# Patient Record
Sex: Female | Born: 1980 | Hispanic: No | Marital: Single | Smoking: Never smoker
Health system: Southern US, Community
[De-identification: ages and names within clinical notes are randomized; demographics above are authoritative.]

## PROBLEM LIST (undated history)

## (undated) DIAGNOSIS — K649 Unspecified hemorrhoids: Secondary | ICD-10-CM

## (undated) DIAGNOSIS — E559 Vitamin D deficiency, unspecified: Secondary | ICD-10-CM

## (undated) DIAGNOSIS — K635 Polyp of colon: Secondary | ICD-10-CM

## (undated) DIAGNOSIS — N83209 Unspecified ovarian cyst, unspecified side: Secondary | ICD-10-CM

## (undated) DIAGNOSIS — K219 Gastro-esophageal reflux disease without esophagitis: Secondary | ICD-10-CM

## (undated) HISTORY — DX: Vitamin D deficiency, unspecified: E55.9

## (undated) HISTORY — DX: Polyp of colon: K63.5

## (undated) HISTORY — DX: Unspecified hemorrhoids: K64.9

## (undated) HISTORY — PX: APPENDECTOMY: SHX54

## (undated) HISTORY — DX: Gastro-esophageal reflux disease without esophagitis: K21.9

## (undated) HISTORY — PX: COLON SURGERY: SHX602

---
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
## 2006-04-22 ENCOUNTER — Ambulatory Visit: Payer: Self-pay | Admitting: Internal Medicine

## 2007-12-13 ENCOUNTER — Emergency Department (HOSPITAL_COMMUNITY): Admission: EM | Admit: 2007-12-13 | Discharge: 2007-12-14 | Payer: Self-pay | Admitting: Emergency Medicine

## 2008-01-12 ENCOUNTER — Emergency Department (HOSPITAL_COMMUNITY): Admission: EM | Admit: 2008-01-12 | Discharge: 2008-01-12 | Payer: Self-pay | Admitting: Emergency Medicine

## 2009-05-26 ENCOUNTER — Emergency Department (HOSPITAL_COMMUNITY): Admission: EM | Admit: 2009-05-26 | Discharge: 2009-05-26 | Payer: Self-pay | Admitting: Emergency Medicine

## 2009-12-03 ENCOUNTER — Emergency Department (HOSPITAL_COMMUNITY): Admission: EM | Admit: 2009-12-03 | Discharge: 2009-12-03 | Payer: Self-pay | Admitting: Emergency Medicine

## 2010-09-03 LAB — DIFFERENTIAL
Basophils Absolute: 0 10*3/uL (ref 0.0–0.1)
Lymphocytes Relative: 39 % (ref 12–46)
Monocytes Absolute: 0.7 10*3/uL (ref 0.1–1.0)
Monocytes Relative: 7 % (ref 3–12)
Neutro Abs: 4.6 10*3/uL (ref 1.7–7.7)

## 2010-09-03 LAB — URINALYSIS, ROUTINE W REFLEX MICROSCOPIC
Glucose, UA: NEGATIVE mg/dL
Leukocytes, UA: NEGATIVE
Protein, ur: NEGATIVE mg/dL
Specific Gravity, Urine: 1.015 (ref 1.005–1.030)
Urobilinogen, UA: 0.2 mg/dL (ref 0.0–1.0)

## 2010-09-03 LAB — COMPREHENSIVE METABOLIC PANEL
Albumin: 3.9 g/dL (ref 3.5–5.2)
BUN: 7 mg/dL (ref 6–23)
Calcium: 8.6 mg/dL (ref 8.4–10.5)
Creatinine, Ser: 0.68 mg/dL (ref 0.4–1.2)
Total Bilirubin: 0.4 mg/dL (ref 0.3–1.2)
Total Protein: 7.7 g/dL (ref 6.0–8.3)

## 2010-09-03 LAB — URINE MICROSCOPIC-ADD ON

## 2010-09-03 LAB — CBC
HCT: 35.8 % — ABNORMAL LOW (ref 36.0–46.0)
MCV: 99.5 fL (ref 78.0–100.0)
Platelets: 293 10*3/uL (ref 150–400)
RDW: 13.5 % (ref 11.5–15.5)

## 2010-09-19 LAB — POCT PREGNANCY, URINE: Preg Test, Ur: NEGATIVE

## 2010-09-19 LAB — URINALYSIS, ROUTINE W REFLEX MICROSCOPIC
Specific Gravity, Urine: 1.022 (ref 1.005–1.030)
Urobilinogen, UA: 0.2 mg/dL (ref 0.0–1.0)

## 2010-09-19 LAB — URINE MICROSCOPIC-ADD ON

## 2010-10-31 NOTE — Discharge Summary (Signed)
Christine Murillo, Christine Murillo              ACCOUNT NO.:  [id number]   MEDICAL RECORD NO.:  [id number]          PATIENT TYPE:  EMS   LOCATION:  ED                           FACILITY:  [facility]   PHYSICIAN:  Naima A. Dillard, M.D. DATE OF BIRTH:  02-18-1981   DATE OF ADMISSION:  12/13/2007  DATE OF DISCHARGE:                               DISCHARGE SUMMARY   HISTORY OF PRESENT ILLNESS:  The patient is a 30 year old gravida 1,  para 0-0-1-0, whose last menstrual period was on December 11, 2007, who  presented to the ER with nausea, vomiting, and abdominal pain for 2  days.  The patient states that the abdominal pain was constant and not  intermittent, and she was unable to tolerate p.o. or take anything for  the pain.  The patient denied having any UTI symptoms or fevers or  chills, but states that she was unable to tolerate any p.o. for the last  2 days since her period started.  When the patient came in, she had  appendicitis, so she had a CT scan, which was significant for slight  prominence of the appendix, but a pelvic mass.  She then had an  ultrasound, which was significant for a right ovarian cyst measuring 5  cm, which was consistent with a hemorrhagic cyst versus endometrioma  with positive venous and arterial flow.  The patient was given Zofran  and morphine, which did resolve the pain.   PAST MEDICAL HISTORY:  As above.   PAST SURGICAL HISTORY:  Significant for exploratory laparotomy in 2002  and she is not sure if it was her right or left ovary and tube that was  removed, for a large ovarian cyst that was benign.  She also had a D&C  for missed AB.   SOCIAL HISTORY:  Negative for tobacco and illicit drug use.  She has  occasional alcohol.   FAMILY HISTORY:  Unremarkable.   MEDICATIONS:  Loestrin 24.   ALLERGIES:  She has no known drug allergies.   PHYSICAL EXAMINATION:  VITAL SIGNS:  Her T-max is 99.2, pulse is 77,  respiratory rate is 78, and blood pressure is 113/64.  GENERAL:   The patient is in no apparent distress.  HEART:  Regular rate and rhythm.  LUNGS:  Clear to auscultation bilaterally.  ABDOMEN:  Nondistended, soft, and nontender.  There is no rebound or  guarding.  PELVIC:  Vulvovaginal exam is within normal limits.  She has no cervical  motion tenderness.  There is scant blood in the vault.  There is no  uterine or adnexal tenderness bilaterally.  EXTREMITIES:  No cyanosis, clubbing, or edema.   Ultrasound and CT as above.  The patient does have a white count of  13.2, hemoglobin of 13, and platelets of 343 with 87 segs.  Her BMET is  within normal limits.  Her UPT was negative.   ASSESSMENT:  Ovarian cyst, possible ruptured hemorrhagic cyst versus  endometrioma.  The patient is better after giving Zofran and morphine  over 8 hours ago.  Her abdomen is nonsurgical, and she is to follow up  with [place] in the office in 4-6 weeks for a repeat ultrasound.  She was  given Zofran and Vicodin as needed.  The patient was also given  appendicitis precautions and told us to call if she had a fever above  100 or continued with nausea and vomiting.  We told her that most likely  this is not appendicitis, but it still could be.   Thank you very much for this consult.      Naima A. Normand Sloop, M.D.  Electronically Signed     NAD/MEDQ  D:  12/14/2007  T:  12/14/2007  Job:  [id number]

## 2011-01-12 ENCOUNTER — Emergency Department (HOSPITAL_COMMUNITY)
Admission: EM | Admit: 2011-01-12 | Discharge: 2011-01-12 | Disposition: A | Discharge status: Home / Self Care | Payer: Managed Care, Other (non HMO) | Attending: Emergency Medicine | Admitting: Emergency Medicine

## 2011-01-12 DIAGNOSIS — N946 Dysmenorrhea, unspecified: Secondary | ICD-10-CM | POA: Insufficient documentation

## 2011-01-12 DIAGNOSIS — R1110 Vomiting, unspecified: Secondary | ICD-10-CM | POA: Insufficient documentation

## 2011-01-12 LAB — URINE MICROSCOPIC-ADD ON

## 2011-01-12 LAB — URINALYSIS, ROUTINE W REFLEX MICROSCOPIC
Glucose, UA: NEGATIVE mg/dL
Protein, ur: 30 mg/dL — AB
Specific Gravity, Urine: 1.026 (ref 1.005–1.030)
pH: 7 (ref 5.0–8.0)

## 2011-01-14 LAB — URINE CULTURE

## 2011-03-15 LAB — WET PREP, GENITAL
Trich, Wet Prep: NONE SEEN
WBC, Wet Prep HPF POC: NONE SEEN
Yeast Wet Prep HPF POC: NONE SEEN

## 2011-03-15 LAB — CBC
HCT: 38.4
Hemoglobin: 13
MCHC: 33.8
MCV: 96.8
RBC: 3.97

## 2011-03-15 LAB — COMPREHENSIVE METABOLIC PANEL
CO2: 24
Calcium: 9.3
Chloride: 102
Creatinine, Ser: 0.58
GFR calc non Af Amer: >60
Glucose, Bld: 110 — ABNORMAL HIGH
Total Bilirubin: 1

## 2011-03-15 LAB — DIFFERENTIAL
Basophils Absolute: 0.1
Eosinophils Absolute: 0
Lymphocytes Relative: 5 — ABNORMAL LOW
Lymphs Abs: 0.7
Neutrophils Relative %: 87 — ABNORMAL HIGH

## 2011-03-15 LAB — URINALYSIS, ROUTINE W REFLEX MICROSCOPIC
Ketones, ur: >80 — AB
Protein, ur: 30 — AB
Urobilinogen, UA: 0.2

## 2011-03-15 LAB — GC/CHLAMYDIA PROBE AMP, GENITAL
Chlamydia, DNA Probe: NEGATIVE
GC Probe Amp, Genital: NEGATIVE

## 2011-03-15 LAB — LIPASE, BLOOD: Lipase: 20

## 2011-03-15 LAB — POCT PREGNANCY, URINE: Preg Test, Ur: NEGATIVE

## 2011-03-15 LAB — URINE MICROSCOPIC-ADD ON

## 2011-03-16 LAB — DIFFERENTIAL
Basophils Absolute: 0.1
Basophils Relative: 1
Eosinophils Absolute: 0
Neutro Abs: 5
Neutrophils Relative %: 75

## 2011-03-16 LAB — POCT I-STAT, CHEM 8
Calcium, Ion: 1.2
Chloride: 104
Glucose, Bld: 104 — ABNORMAL HIGH
HCT: 37
Hemoglobin: 12.6
Potassium: 2.9 — ABNORMAL LOW

## 2011-03-16 LAB — CBC
MCHC: 33.8
MCV: 96.1
RDW: 13.2

## 2011-06-07 ENCOUNTER — Other Ambulatory Visit: Payer: Self-pay | Admitting: Gastroenterology

## 2011-06-07 DIAGNOSIS — R112 Nausea with vomiting, unspecified: Secondary | ICD-10-CM

## 2011-06-07 DIAGNOSIS — R1031 Right lower quadrant pain: Secondary | ICD-10-CM

## 2011-06-14 ENCOUNTER — Ambulatory Visit
Admission: RE | Admit: 2011-06-14 | Discharge: 2011-06-14 | Disposition: A | Discharge status: Home / Self Care | Payer: Managed Care, Other (non HMO) | Source: Ambulatory Visit | Attending: Gastroenterology | Admitting: Gastroenterology

## 2011-06-14 DIAGNOSIS — R1031 Right lower quadrant pain: Secondary | ICD-10-CM

## 2011-06-14 DIAGNOSIS — R112 Nausea with vomiting, unspecified: Secondary | ICD-10-CM

## 2011-06-14 MED ORDER — IOHEXOL 300 MG/ML  SOLN
100.0000 mL | Freq: Once | INTRAMUSCULAR | Status: AC | PRN
  Administered 2011-06-14: 100 mL via INTRAVENOUS

## 2011-07-11 ENCOUNTER — Other Ambulatory Visit: Payer: Self-pay | Admitting: Urology

## 2011-07-17 ENCOUNTER — Encounter (HOSPITAL_BASED_OUTPATIENT_CLINIC_OR_DEPARTMENT_OTHER): Payer: Self-pay | Admitting: *Deleted

## 2011-07-17 NOTE — Progress Notes (Signed)
Unable to complete call. Pt to call back later.

## 2011-07-17 NOTE — Progress Notes (Signed)
Pt instructed  Npo p mn 2/3 x bcp w sip of h2o.  To [facility] 07/23/11 @ 0615.  Needs hgb, urine hcg on arrival.

## 2011-07-22 NOTE — H&P (Signed)
History of Present Illness     Christine Murillo is a 31 year old female patient of Dr. Bosie Clos seen in office consultation today for further evaluation of a bladder mass incidentally noted on a CT scan done on 06/14/11 for nausea and right lower quadrant pain with constipation. I note she did have microscopic hematuria detected at the time of the emergency room visit in 7/12. She has never had any gross hematuria but reports that when she has been evaluated for her yearly exams in the past she was found to have microscopic hematuria. She denies any dysuria. She does report that occasionally she does have to strain to urinate. She has not passed any tissue that she has noted. 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 This would be considered severe with no other associated signs and symptoms other than as above. She indicates that she has had abdominal pain with associated nausea and vomiting and difficulty having a bowel movement since 5/12.   Past Medical History Problems  1. History of  Esophageal Reflux 530.81 2. 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 History of  Salpingo-oophorectomy Left  Current Meds 1. Align; Therapy: (Recorded:23Jan2013) to 2. Loestrin 24 Fe 1-20 MG-MCG Oral Tablet; Therapy: 04Oct2012 to  Allergies Medication  1. No Known Drug Allergies  Family History Problems  1. Family history of  Death In The Family Father 2. Family history of  Family Health Status - Mother's Age 33. Family history of  No Significant Family History  Social History Problems    Alcohol Use   Caffeine Use   Marital History - Single    Never A Smoker  Review of Systems Genitourinary, constitutional, skin, eye, otolaryngeal, hematologic/lymphatic, cardiovascular, pulmonary, endocrine, musculoskeletal, gastrointestinal, neurological and psychiatric system(s) were reviewed and pertinent findings if present are noted.  Genitourinary: nocturia, but no hematuria.  Gastrointestinal: nausea, abdominal pain, constipation and melena.  ENT: sinus problems.  Hematologic/Lymphatic: a tendency to easily bruise.    Vitals Vital Signs [Data Includes: Last 1 Day]  23Jan2013 10:03AM  BMI Calculated: 22.18 BSA Calculated: 1.71 Height: 5 ft 6 in Weight: 138 lb  Blood Pressure: 105 / 69 Heart Rate: 71  Physical Exam Constitutional: Well nourished and well developed . No acute distress.  ENT:. The ears and nose are normal in appearance.  Neck: The appearance of the neck is normal and no neck mass is present.  Pulmonary: No respiratory distress and normal respiratory rhythm and effort.  Cardiovascular: Heart rate and rhythm are normal . No peripheral edema.  Abdomen: lower midline incision site(s) well healed. The abdomen is soft and nontender. No masses are palpated. No CVA tenderness. No hernias are palpable. No hepatosplenomegaly noted.  Genitourinary: Examination of the external genitalia shows normal female external genitalia and no lesions. The urethra is normal in appearance and not tender. There is no urethral mass. Vaginal exam demonstrates no abnormalities. The adnexa are palpably normal. The bladder is non tender and not distended. The anus is normal on inspection. The perineum is normal on inspection.  Lymphatics: The femoral and inguinal nodes are not enlarged or tender.  Skin: Normal skin turgor, no visible rash and no visible skin lesions.  Neuro/Psych:. Mood and  affect are appropriate.    Results/Data Urine  COLOR: YELLOW  Reference Range YELLOW APPEARANCE: CLOUDY  Abnormal Reference Range CLEAR SPECIFIC GRAVITY: 1.030   Reference Range 1.005-1.030 pH: 5.5  Reference Range 5.0-8.0 GLUCOSE: NEG mg/dL Reference Range NEG BILIRUBIN: NEG  Reference Range NEG KETONE: NEG mg/dL Reference Range NEG BLOOD: LARGE  Abnormal Reference Range NEG PROTEIN: TRACE mg/dL Reference Range NEG UROBILINOGEN: 0.2 mg/dL Reference Range 7.8-2.9 NITRITE: NEG  Reference Range NEG LEUKOCYTE ESTERASE: MOD  Abnormal Reference Range NEG SQUAMOUS EPITHELIAL/HPF: MANY  Abnormal Reference Range RARE WBC: 7-10 WBC/hpf Abnormal Reference Range <4 RBC: 7-10 RBC/hpf Abnormal Reference Range <4 BACTERIA: MANY  Abnormal Reference Range RARE CRYSTALS: NONE SEEN  Reference Range NEG CASTS: NONE SEEN  Reference Range NEG  Old records or history reviewed: Notes from Dr. Marge Duncans office as above.  The following images/tracing/specimen were independently visualized:  CT as below.  The following clinical lab reports were reviewed:  Urinalysis as above.  The following radiology reports were reviewed: CT scan.    Procedure  Procedure: Cystoscopy  Indication: Bladder Mass.  Informed Consent: Risks, benefits, and potential adverse events were discussed and informed consent was obtained from the patient.  Prep: The patient was prepped with hibiclens.  Procedure Note:  Urethral meatus:. No abnormalities.  Anterior urethra: No abnormalities.  Bladder: Visulization was clear. The ureteral orifices were in the normal anatomic position bilaterally and had clear efflux of urine. A sessile tumor was seen in the bladder. This tumor was located on the left side, on the posterior aspect of the bladder. The patient tolerated the procedure well.  Complications: None.    Assessment Assessed  1. 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 This needs to be biopsied and I believe a resection of the majority of the tumor should be undertaken however once pathologic evaluation has been undertaken she may end up needing a partial cystectomy. This was discussed with the patient and I went over the procedure of transurethral resection with her in detail including its risks and complications.   Plan Bladder Mass (3.4  Cm)  1. Follow-up Schedule Surgery Office  Follow-up  Requested for: 23Jan2013 Bladder Mass (3.4  Cm),   2. Cysto  Done: 23Jan2013 Health Maintenance (V70.0)  3. UA With REFLEX  Done: 23Jan2013 09:54AM   She will be scheduled for cystoscopy with transurethral resectional biopsy of her bladder lesion as an outpatient.

## 2011-07-23 ENCOUNTER — Ambulatory Visit (HOSPITAL_BASED_OUTPATIENT_CLINIC_OR_DEPARTMENT_OTHER): Payer: BC Managed Care – PPO | Admitting: Anesthesiology

## 2011-07-23 ENCOUNTER — Ambulatory Visit (HOSPITAL_BASED_OUTPATIENT_CLINIC_OR_DEPARTMENT_OTHER)
Admission: RE | Admit: 2011-07-23 | Discharge: 2011-07-23 | Disposition: A | Discharge status: Home / Self Care | Payer: BC Managed Care – PPO | Source: Ambulatory Visit | Attending: Urology | Admitting: Urology

## 2011-07-23 ENCOUNTER — Other Ambulatory Visit: Payer: Self-pay | Admitting: Urology

## 2011-07-23 ENCOUNTER — Encounter (HOSPITAL_BASED_OUTPATIENT_CLINIC_OR_DEPARTMENT_OTHER)
Admission: RE | Disposition: A | Discharge status: Home / Self Care | Payer: Self-pay | Source: Ambulatory Visit | Attending: Urology

## 2011-07-23 ENCOUNTER — Encounter (HOSPITAL_BASED_OUTPATIENT_CLINIC_OR_DEPARTMENT_OTHER): Payer: Self-pay | Admitting: Anesthesiology

## 2011-07-23 ENCOUNTER — Encounter (HOSPITAL_BASED_OUTPATIENT_CLINIC_OR_DEPARTMENT_OTHER): Payer: Self-pay | Admitting: *Deleted

## 2011-07-23 DIAGNOSIS — K219 Gastro-esophageal reflux disease without esophagitis: Secondary | ICD-10-CM | POA: Insufficient documentation

## 2011-07-23 DIAGNOSIS — Z01812 Encounter for preprocedural laboratory examination: Secondary | ICD-10-CM | POA: Insufficient documentation

## 2011-07-23 DIAGNOSIS — N329 Bladder disorder, unspecified: Secondary | ICD-10-CM | POA: Insufficient documentation

## 2011-07-23 DIAGNOSIS — D494 Neoplasm of unspecified behavior of bladder: Secondary | ICD-10-CM

## 2011-07-23 HISTORY — DX: Unspecified ovarian cyst, unspecified side: N83.209

## 2011-07-23 HISTORY — PX: TRANSURETHRAL RESECTION OF BLADDER TUMOR: SHX2575

## 2011-07-23 LAB — POCT PREGNANCY, URINE: Preg Test, Ur: NEGATIVE

## 2011-07-23 LAB — POCT HEMOGLOBIN-HEMACUE: Hemoglobin: 13 g/dL (ref 12.0–15.0)

## 2011-07-23 SURGERY — TURBT (TRANSURETHRAL RESECTION OF BLADDER TUMOR)
Anesthesia: General | Site: Bladder | Wound class: Clean Contaminated

## 2011-07-23 MED ORDER — PHENAZOPYRIDINE HCL 200 MG PO TABS: 200.0000 mg | ORAL_TABLET | Freq: Three times a day (TID) | ORAL | Status: AC | PRN

## 2011-07-23 MED ORDER — PROMETHAZINE HCL 25 MG/ML IJ SOLN: 6.2500 mg | INTRAMUSCULAR | Status: DC | PRN

## 2011-07-23 MED ORDER — BELLADONNA ALKALOIDS-OPIUM 16.2-60 MG RE SUPP
RECTAL | Status: DC | PRN
  Administered 2011-07-23: 1 via RECTAL

## 2011-07-23 MED ORDER — FENTANYL CITRATE 0.05 MG/ML IJ SOLN: 25.0000 ug | INTRAMUSCULAR | Status: DC | PRN

## 2011-07-23 MED ORDER — SODIUM CHLORIDE 0.9 % IR SOLN
Status: DC | PRN
  Administered 2011-07-23: 6000 mL

## 2011-07-23 MED ORDER — CIPROFLOXACIN IN D5W 200 MG/100ML IV SOLN
200.0000 mg | INTRAVENOUS | Status: AC
  Administered 2011-07-23: 200 mg via INTRAVENOUS

## 2011-07-23 MED ORDER — FENTANYL CITRATE 0.05 MG/ML IJ SOLN
INTRAMUSCULAR | Status: DC | PRN
  Administered 2011-07-23 (×3): 25 ug via INTRAVENOUS
  Administered 2011-07-23: 50 ug via INTRAVENOUS
  Administered 2011-07-23: 25 ug via INTRAVENOUS
  Administered 2011-07-23: 50 ug via INTRAVENOUS

## 2011-07-23 MED ORDER — ONDANSETRON HCL 4 MG/2ML IJ SOLN
INTRAMUSCULAR | Status: DC | PRN
  Administered 2011-07-23: 4 mg via INTRAVENOUS

## 2011-07-23 MED ORDER — 0.9 % SODIUM CHLORIDE (POUR BTL) OPTIME
TOPICAL | Status: DC | PRN
  Administered 2011-07-23: 500 mL

## 2011-07-23 MED ORDER — PROPOFOL 10 MG/ML IV EMUL
INTRAVENOUS | Status: DC | PRN
  Administered 2011-07-23: 200 mg via INTRAVENOUS

## 2011-07-23 MED ORDER — HYDROCODONE-ACETAMINOPHEN 10-300 MG PO TABS: 1.0000 | ORAL_TABLET | Freq: Four times a day (QID) | ORAL | Status: DC | PRN

## 2011-07-23 MED ORDER — PHENAZOPYRIDINE HCL 200 MG PO TABS: 200.0000 mg | ORAL_TABLET | Freq: Once | ORAL | Status: DC

## 2011-07-23 MED ORDER — LACTATED RINGERS IV SOLN
INTRAVENOUS | Status: DC
  Administered 2011-07-23 (×3): via INTRAVENOUS

## 2011-07-23 MED ORDER — OXYBUTYNIN CHLORIDE 5 MG PO TABS: 5.0000 mg | ORAL_TABLET | Freq: Once | ORAL | Status: DC

## 2011-07-23 MED ORDER — STERILE WATER FOR IRRIGATION IR SOLN
Status: DC | PRN
  Administered 2011-07-23: 500 mL

## 2011-07-23 MED ORDER — LIDOCAINE HCL (CARDIAC) 20 MG/ML IV SOLN
INTRAVENOUS | Status: DC | PRN
  Administered 2011-07-23: 80 mg via INTRAVENOUS

## 2011-07-23 SURGICAL SUPPLY — 28 items
BAG DRAIN URO-CYSTO SKYTR STRL (DRAIN) ×2 IMPLANT
BAG URINE DRAINAGE (UROLOGICAL SUPPLIES) IMPLANT
BAG URINE LEG 19OZ MD ST LTX (BAG) IMPLANT
CANISTER SUCT LVC 12 LTR MEDI- (MISCELLANEOUS) ×4 IMPLANT
CATH FOLEY 2WAY SLVR  5CC 20FR (CATHETERS)
CATH FOLEY 2WAY SLVR  5CC 22FR (CATHETERS)
CATH FOLEY 2WAY SLVR  5CC 24FR (CATHETERS)
CATH FOLEY 2WAY SLVR 5CC 20FR (CATHETERS) IMPLANT
CATH FOLEY 2WAY SLVR 5CC 22FR (CATHETERS) IMPLANT
CATH FOLEY 2WAY SLVR 5CC 24FR (CATHETERS) IMPLANT
CLOTH BEACON ORANGE TIMEOUT ST (SAFETY) ×2 IMPLANT
DRAPE CAMERA CLOSED 9X96 (DRAPES) ×2 IMPLANT
ELECT BUTTON BIOP 24F 90D PLAS (MISCELLANEOUS) ×2 IMPLANT
ELECT LOOP HF 26F 30D .35MM (CUTTING LOOP) IMPLANT
ELECT REM PT RETURN 9FT ADLT (ELECTROSURGICAL)
ELECTRODE REM PT RTRN 9FT ADLT (ELECTROSURGICAL) IMPLANT
EVACUATOR MICROVAS BLADDER (UROLOGICAL SUPPLIES) ×2 IMPLANT
GLOVE BIO SURGEON STRL SZ8 (GLOVE) ×2 IMPLANT
GLOVE INDICATOR 6.5 STRL GRN (GLOVE) ×4 IMPLANT
GOWN PREVENTION PLUS LG XLONG (DISPOSABLE) ×2 IMPLANT
GOWN STRL REIN XL XLG (GOWN DISPOSABLE) ×2 IMPLANT
HOLDER FOLEY CATH W/STRAP (MISCELLANEOUS) IMPLANT
IV NS IRRIG 3000ML ARTHROMATIC (IV SOLUTION) ×4 IMPLANT
KIT ASPIRATION TUBING (SET/KITS/TRAYS/PACK) IMPLANT
LOOP CUTTING 24FR OLYMPUS (CUTTING LOOP) IMPLANT
PACK CYSTOSCOPY (CUSTOM PROCEDURE TRAY) ×2 IMPLANT
PLUG CATH AND CAP STER (CATHETERS) IMPLANT
WATER STERILE IRR 3000ML UROMA (IV SOLUTION) IMPLANT

## 2011-07-23 NOTE — Anesthesia Preprocedure Evaluation (Signed)
Anesthesia Evaluation  Patient identified by MRN, date of birth, ID band Patient awake    Reviewed: Allergy & Precautions, H&P , NPO status , Patient's Chart, lab work & pertinent test results, reviewed documented beta blocker date and time   Airway Mallampati: I TM Distance: >3 FB Neck ROM: Full    Dental  (+) Teeth Intact   Pulmonary neg pulmonary ROS,  clear to auscultation        Cardiovascular neg cardio ROS Regular Normal Denies cardiac symptoms   Neuro/Psych Negative Neurological ROS  Negative Psych ROS   GI/Hepatic negative GI ROS, Neg liver ROS,   Endo/Other  Negative Endocrine ROS  Renal/GU negative Renal ROS   Bladder lesion    Musculoskeletal negative musculoskeletal ROS (+)   Abdominal   Peds negative pediatric ROS (+)  Hematology negative hematology ROS (+)   Anesthesia Other Findings Upper retainer  Reproductive/Obstetrics negative OB ROS                           Anesthesia Physical Anesthesia Plan  ASA: I  Anesthesia Plan: General   Post-op Pain Management:    Induction: Intravenous  Airway Management Planned: LMA  Additional Equipment:   Intra-op Plan:   Post-operative Plan: Extubation in OR  Informed Consent: I have reviewed the patients History and Physical, chart, labs and discussed the procedure including the risks, benefits and alternatives for the proposed anesthesia with the patient or authorized representative who has indicated his/her understanding and acceptance.     Plan Discussed with: CRNA and Surgeon  Anesthesia Plan Comments:         Anesthesia Quick Evaluation

## 2011-07-23 NOTE — Interval H&P Note (Signed)
History and Physical Interval Note:  07/23/2011 7:01 AM  Christine Murillo  has presented today for surgery, with the diagnosis of BLADDER TUMOR  The various methods of treatment have been discussed with the patient and family. After consideration of risks, benefits and other options for treatment, the patient has consented to  Procedure(s): TRANSURETHRAL RESECTION OF BLADDER TUMOR (TURBT) as a surgical intervention .  The patients' history has been reviewed, patient examined, no change in status, stable for surgery.  I have reviewed the patients' chart and labs.  Questions were answered to the patient's satisfaction.     [place]

## 2011-07-23 NOTE — Anesthesia Postprocedure Evaluation (Signed)
  Anesthesia Post-op Note  Patient: Christine Murillo  Procedure(s) Performed:  TRANSURETHRAL RESECTION OF BLADDER TUMOR (TURBT)  Patient Location: PACU  Anesthesia Type: General  Level of Consciousness: oriented and sedated  Airway and Oxygen Therapy: Patient Spontanous Breathing  Post-op Pain: mild  Post-op Assessment: Post-op Vital signs reviewed, Patient's Cardiovascular Status Stable, Respiratory Function Stable and Patent Airway  Post-op Vital Signs: stable  Complications: No apparent anesthesia complications

## 2011-07-23 NOTE — Op Note (Signed)
PATIENT:  Christine Murillo  PRE-OPERATIVE DIAGNOSIS: Bladder tumor  POST-OPERATIVE DIAGNOSIS: Same  PROCEDURE:  Procedure(s): TRANSURETHRAL RESECTION OF BLADDER TUMOR (TURBT) (3.4cm.)  SURGEON:  Surgeon(s): Garnett Farm  ANESTHESIA:   General  EBL:  Minimal  DRAINS: None  SPECIMEN:  Source of Specimen:  Bladder tumor  DISPOSITION OF SPECIMEN:  PATHOLOGY  Indication: This patient is a 31 year old female who was found to have an incidentally noted mass in the bladder on CT scan done on 06/14/11. She has little to no urinary symptoms and has never seen any gross hematuria. Her past history is significant in that she underwent excision of a pseudo-mucinous cystadenoma with intraepithelial carcinoma involving the left ovary in 11/02. 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 Her CT scan revealed a mass confined to the bladder it did not appear to be associated with any adenopathy in the pelvis.  Description of operation: The patient was taken to the operating room and administered general anesthesia. He was then placed on the table and moved to the dorsal lithotomy position after which her genitalia was sterilely prepped and draped. An official timeout was then performed.  The 26 French resectoscope with Timberlake obturator was then introduced into the bladder and the obturator was removed. The resectoscope element with 12 lens was then inserted and the bladder was fully and systematically inspected. Ureteral orifices were noted to be in the normal anatomic positions.   I again noted the tumor that appeared to be a raised area covered with relatively normal appearing mucosa there did have some areas of bullous edema. This was photographed and then I began my resection. Because the lesion was confined to the bladder without obvious extravesical extension I  excised the mucosa overlying the lesion and in doing so I noted what appeared to be several areas of old blood in cystic spaces like "chocolate cysts". I resected deeper into the lesion and noted more of these cysts but was reluctant to try to excise all of the lesion as there was a concern of perforation since was unable to identify a normal detrusor muscle and therefore the outer aspects of the bladder wall. I fulgurated all bleeding points although there were very few. Reinspection of the bladder revealed there was no bleeding from the resection site and the majority of the intravesical portion of the tumor had been resected. The Microvasive evacuator was then used to irrigate the bladder and remove all of the portions of bladder tumor which were sent to pathology. I then drained the bladder and removed the resectoscope. The patient tolerated the procedure well and there were no intraoperative complications.  PLAN OF CARE: Discharge to home after PACU  PATIENT DISPOSITION:  PACU - hemodynamically stable.

## 2011-07-23 NOTE — Transfer of Care (Signed)
Immediate Anesthesia Transfer of Care Note  Patient: Christine Murillo  Procedure(s) Performed:  TRANSURETHRAL RESECTION OF BLADDER TUMOR (TURBT)  Patient Location: Patient transported to PACU with oxygen via face mask at 4 Liters / Min  Anesthesia Type: General  Level of Consciousness: awake and alert   Airway & Oxygen Therapy: Patient Spontanous Breathing and Patient connected to face mask oxygen Post-op Assessment: Report given to PACU RN and Post -op Vital signs reviewed and stable  Post vital signs: Reviewed and stable  Complications: No apparent anesthesia complications

## 2011-07-23 NOTE — Anesthesia Procedure Notes (Signed)
Procedure Name: LMA Insertion Date/Time: 07/23/2011 7:35 AM Performed by: Lorrin Jackson Pre-anesthesia Checklist: Patient identified, Emergency Drugs available, Suction available and Patient being monitored Patient Re-evaluated:Patient Re-evaluated prior to inductionOxygen Delivery Method: Circle System Utilized Preoxygenation: Pre-oxygenation with 100% oxygen Intubation Type: IV induction Ventilation: Mask ventilation without difficulty LMA: LMA inserted LMA Size: 4.0 Number of attempts: 1 Airway Equipment and Method: bite block Placement Confirmation: positive ETCO2 Tube secured with: Tape Dental Injury: Teeth and Oropharynx as per pre-operative assessment  Comments: Inserted by Dr. Shireen Quan

## 2011-07-24 ENCOUNTER — Encounter (HOSPITAL_BASED_OUTPATIENT_CLINIC_OR_DEPARTMENT_OTHER): Payer: Self-pay | Admitting: Urology

## 2011-07-25 NOTE — Addendum Note (Signed)
Addendum  created 07/25/11 0845 by Phillips Grout, MD   Modules edited:Notes Section

## 2011-07-25 NOTE — Anesthesia Postprocedure Evaluation (Signed)
Follow up call made to paitient on 07/25/11 RE: sore tongue.  Pt had LMA on 2/4. She now c/o sore tip of tongue. Resolving. No other issues.  [employer]

## 2011-08-23 ENCOUNTER — Encounter (INDEPENDENT_AMBULATORY_CARE_PROVIDER_SITE_OTHER): Payer: BC Managed Care – PPO | Admitting: Obstetrics and Gynecology

## 2011-08-23 DIAGNOSIS — N949 Unspecified condition associated with female genital organs and menstrual cycle: Secondary | ICD-10-CM

## 2011-08-23 DIAGNOSIS — N809 Endometriosis, unspecified: Secondary | ICD-10-CM

## 2011-08-24 ENCOUNTER — Encounter: Payer: Self-pay | Admitting: *Deleted

## 2011-09-05 ENCOUNTER — Ambulatory Visit: Payer: BC Managed Care – PPO | Admitting: Gynecologic Oncology

## 2011-09-27 ENCOUNTER — Encounter: Payer: Self-pay | Admitting: Gynecologic Oncology

## 2011-09-27 ENCOUNTER — Ambulatory Visit: Payer: BC Managed Care – PPO | Attending: Gynecologic Oncology | Admitting: Gynecologic Oncology

## 2011-09-27 VITALS — BP 100/60 | HR 60 | Temp 98.4°F | Resp 16 | Ht 65.75 in | Wt 142.0 lb

## 2011-09-27 DIAGNOSIS — N83209 Unspecified ovarian cyst, unspecified side: Secondary | ICD-10-CM

## 2011-09-27 DIAGNOSIS — Z9079 Acquired absence of other genital organ(s): Secondary | ICD-10-CM | POA: Insufficient documentation

## 2011-09-27 DIAGNOSIS — Z8543 Personal history of malignant neoplasm of ovary: Secondary | ICD-10-CM | POA: Insufficient documentation

## 2011-09-27 DIAGNOSIS — D3910 Neoplasm of uncertain behavior of unspecified ovary: Secondary | ICD-10-CM

## 2011-09-27 NOTE — Progress Notes (Signed)
Consult Note: Gyn-Onc  Consult was requested by Dr. Zenia Resides the evaluation of Christine Murillo 31 y.o. female  CC:  Chief Complaint  Patient presents with  . ovarian low malignant potential tumor    New consult    HPI: This is a 31 year old gravida 0 whose history dates back to November 2002 when at the age of 20 she underwent a left salpingo-oophorectomy. 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  She has been observed since and did well until 2011 when she developed severe dysmenorrhea.   Associated with that dysmenorrhea was severe GI upset bloating nausea, vomiting lower abdominal crampy pain that radiates to the flank. She was seen by a gastroenterologist and a CT scan of the abdomen and pelvis was collected on 06/14/2011. The findings were notable for normal bowel gas pattern uterus was unremarkable slight right adnexal fullness was appreciated but most notably there was a 3.2 x 3.4 x 2.4 cm superior bladder wall mass. There was no evidence of pelvic lymphadenopathy and interestingly the report stated that peritoneal nodularity had resolved which suggest prior peritoneal nodularity was present.  She was evaluated by Dr. Ihor Gully and underwent transurethral cystoscopy the findings were notable for multiple cystic areas filled with old blood in the appearance of chocolate cysts within the bladder wall.  One of the bladder lesions was removed and the pathology was consistent with polypoid urethral mucosa with hyperemia and focal hemosiderin deposition no evidence of malignancy.  The patient has since been started on Loestrin administered in a continuous fashion with resolution of the dysmenorrhea and associated symptomatology.  Review of Systems:  Constitutional  Feels well,  Skin/Breast  No rash, sores, jaundice, itching, dryness Cardiovascular  No chest pain, shortness of breath, or edema  Pulmonary  No  cough or wheeze.  Gastro Intestinal  No nausea, vomitting, or diarrhoea. No bright red blood per rectum, no abdominal pain, change in bowel movement, or constipation.  Genito Urinary  No frequency, urgency, dysuria, no vaginal bleeding no menses  Musculo Skeletal  No myalgia, arthralgia, joint swelling or pain  Neurologic  No weakness, numbness, change in gait,  Psychology  No depression, anxiety, insomnia.    Current Meds:  Outpatient Encounter Prescriptions as of 09/27/2011  Medication Sig Dispense Refill  . norethindrone-ethinyl estradiol (JUNEL FE,GILDESS FE,LOESTRIN FE) 1-20 MG-MCG tablet Take 1 tablet by mouth daily.      . Hydrocodone-Acetaminophen (VICODIN HP) 10-300 MG TABS Take 1-2 tablets by mouth every 6 (six) hours as needed.  30 each  0    Allergy: No Known Allergies  Social Hx:   History   Social History  . Marital Status: Single    Spouse Name: N/A    Number of Children: N/A  . Years of Education: N/A   Occupational History  . Not on file.   Social History Main Topics  . Smoking status: Never Smoker   . Smokeless tobacco: Not on file  . Alcohol Use: Yes     socially  . Drug Use: No  . Sexually Active: Yes    Birth Control/ Protection: Pill   Other Topics Concern  . Not on file   Social History Narrative  . No narrative on file    Past Surgical Hx:  Past Surgical History  Procedure Date  . Dilation and curettage of uterus 2003  . Diagnostic laparoscopy 2002    removal l ovarian mass  . Transurethral resection of  bladder tumor 07/23/2011    Procedure: TRANSURETHRAL RESECTION OF BLADDER TUMOR (TURBT);  Surgeon: Garnett Farm, MD;  Location: [facility];  Service: Urology;  Laterality: N/A;    Past Medical Hx:  Past Medical History  Diagnosis Date  . Other and unspecified ovarian cysts   . Constipation   . Endometriosis 2013    Past Gynecological History:  Menarche occurred at age of 25 with regular menses. Severe  dysmenorrhea from 2011. Reports remote history of abnormal Pap test  Family Hx: History reviewed. No pertinent family history.  Vitals:  Blood pressure 100/60, pulse 60, temperature 98.4 F (36.9 C), temperature source Oral, resp. rate 16, height 5' 5.75" (1.67 m), weight 142 lb (64.411 kg), last menstrual period 07/22/2011.  Physical Exam: WD in NAD Neck  Supple NROM, without any enlargements.  Lymph Node Survey No cervical supraclavicular or inguinal adenopathy Cardiovascular  Pulse normal rate, regularity and rhythm. S1 and S2 normal.  Lungs  Clear to auscultation bilateraly, without wheezes/crackles/rhonchi. Good air movement.  Psychiatry  Alert and oriented to person, place, and time  Abdomen  Normoactive bowel sounds, abdomen soft, non-tender and obese. Surgical  sites intact without evidence of hernia.  Back No CVA tenderness Genito Urinary  Vulva/vagina: Normal external female genitalia.  No lesions. No discharge or bleeding.  Bladder/urethra:  No lesions or masses  Vagina: No visible or palpable masses Cervix: Normal appearing, no lesions.  Uterus: Small, mobile, no nodularity within the cul-de-sac or the parametrium  Adnexa: No palpable masses. Rectal  Good tone, no masses no cul de sac nodularity.  Extremities  No bilateral cyanosis, clubbing or edema.   Assessment/Plan:  Ms. Christine Murillo  is a 31 y.o.  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  In the interim 10 years she has been without any evidence of disease. I shared with the patient that given this history the recommendation would be made for use of oral contraceptive pills until she desired to begin having children. I shared with her the importance of removal of the other ovary when she is satisfied with her parity.    With respect to the lesion in the bladder the other symptomatology that have resolved with the use of continuous oral contraceptive pills,  I told this was important to continue this treatment regimen as long as possible as there a have been reported instances of progression of endometrial lesions to malignancy.  My recommendation is that this patient  be evaluated on a six-month basis. I shared with her that it was dependent on her preferences whether she wished to alternate visits with me and Dr. Normand Sloop.  At the patient's preference to continue under Dr. Redmond Baseman care.   Laurette Schimke, MD, PhD 09/27/2011, 2:16 PM

## 2011-09-27 NOTE — Patient Instructions (Signed)
Followup with Dr. Normand Sloop every 6 months

## 2011-10-08 ENCOUNTER — Other Ambulatory Visit: Payer: Self-pay | Admitting: Obstetrics and Gynecology

## 2011-10-08 MED ORDER — NORETHIN ACE-ETH ESTRAD-FE 1-20 MG-MCG PO TABS: 1.0000 | ORAL_TABLET | Freq: Every day | ORAL | Status: DC

## 2011-10-08 NOTE — Telephone Encounter (Signed)
Routed to niccole 

## 2011-10-08 NOTE — Telephone Encounter (Signed)
Spoke with pt rgd msg pt need rf on bc until aex 5/13 informed pt rx called to pharm pt voice understanding

## 2011-11-02 ENCOUNTER — Other Ambulatory Visit: Payer: Self-pay | Admitting: Obstetrics and Gynecology

## 2011-11-02 ENCOUNTER — Other Ambulatory Visit: Payer: Self-pay

## 2011-11-02 MED ORDER — NORETHIN ACE-ETH ESTRAD-FE 1-20 MG-MCG PO TABS: 1.0000 | ORAL_TABLET | Freq: Every day | ORAL | Status: DC

## 2011-11-02 NOTE — Telephone Encounter (Signed)
TRIAGE/EPIC °

## 2011-11-02 NOTE — Telephone Encounter (Signed)
Spoke with pt rgd msg informed pt rx sent to pharm with refills to aex pt voice understanding

## 2011-11-15 ENCOUNTER — Ambulatory Visit: Payer: BC Managed Care – PPO | Admitting: Obstetrics and Gynecology

## 2011-12-06 ENCOUNTER — Encounter: Payer: Self-pay | Admitting: Obstetrics and Gynecology

## 2011-12-06 ENCOUNTER — Ambulatory Visit (INDEPENDENT_AMBULATORY_CARE_PROVIDER_SITE_OTHER): Payer: BC Managed Care – PPO | Admitting: Obstetrics and Gynecology

## 2011-12-06 VITALS — BP 110/62 | HR 70 | Temp 99.5°F | Ht 65.5 in | Wt 148.0 lb

## 2011-12-06 DIAGNOSIS — E559 Vitamin D deficiency, unspecified: Secondary | ICD-10-CM

## 2011-12-06 DIAGNOSIS — N83209 Unspecified ovarian cyst, unspecified side: Secondary | ICD-10-CM

## 2011-12-06 DIAGNOSIS — Z113 Encounter for screening for infections with a predominantly sexual mode of transmission: Secondary | ICD-10-CM

## 2011-12-06 DIAGNOSIS — Z124 Encounter for screening for malignant neoplasm of cervix: Secondary | ICD-10-CM

## 2011-12-06 DIAGNOSIS — Z01419 Encounter for gynecological examination (general) (routine) without abnormal findings: Secondary | ICD-10-CM

## 2011-12-06 MED ORDER — NORETHIN ACE-ETH ESTRAD-FE 1-20 MG-MCG PO TABS: 1.0000 | ORAL_TABLET | Freq: Every day | ORAL | Status: DC

## 2011-12-06 MED ORDER — ETONOGESTREL-ETHINYL ESTRADIOL 0.12-0.015 MG/24HR VA RING: VAGINAL_RING | VAGINAL | Status: DC

## 2011-12-06 NOTE — Progress Notes (Signed)
Subjective:    Christine Murillo is a 31 y.o. female, G1P0010, who presents for an annual exam. The patient reports spotting.  Last prescription filled at [facility] [address]was given Norethindrone .35 mg instead of Junel 1/20.  Discussed combination hormone therapy and patient would like to try the Nuvaring.  Menstrual cycle:   LMP: Patient's last menstrual period was 11/16/2011.             Review of Systems Pertinent items are noted in HPI. Denies pelvic pain, urinary tract symptoms, vaginitis symptoms, irregular bleeding, menopausal symptoms, change in bowel habits or rectal bleeding   Objective:    BP 110/62  Pulse 70  Temp 99.5 F (37.5 C) (Oral)  Ht 5' 5.5" (1.664 m)  Wt 148 lb (67.132 kg)  BMI 24.25 kg/m2  LMP 11/16/2011   Wt Readings from Last 1 Encounters:  12/06/11 148 lb (67.132 kg)   Body mass index is 24.25 kg/(m^2). General Appearance: Alert, no acute distress HEENT: Grossly normal Neck / Thyroid: Supple, no thyromegaly or cervical adenopathy Lungs: Clear to auscultation bilaterally Back: No CVA tenderness Breast Exam: No masses or nodes.No dimpling, nipple retraction or discharge. Cardiovascular: Regular rate and rhythm.  Gastrointestinal: Soft, non-tender, no masses or organomegaly Pelvic Exam: EGBUS-wnl, vagina-normal rugae, cervix- without lesions or tenderness, uterus appears normal size shape and consistency, adnexae-no masses or tenderness Rectovaginal: no masses and normal sphincter tone Lymphatic Exam: Non-palpable nodes in neck, clavicular,  axillary, or inguinal regions  Skin: no rashes or abnormalities Extremities: no clubbing cyanosis or edema  Neurologic: grossly normal Psychiatric: Alert and oriented  Wet Prep: Urinalysis: UPT: negative   Assessment:   Routine GYN Exam H/O Vitamin D Deficiency Endometriosis Plan:  Refll Junel 1/20 Fe  #1 1 po qd continuously take the active pills 11 refills  Sample: Nuvaring #2 to try for next 2  months and will let office know if she wants to continue; Nuvaring brochure also given  PAP sent  Vitamin D 25-H-pending;  patient reminded to take daily vitamin D 1000 units, to maintain a normal level  RTO 1 year or prn  Jasmeen Fritsch,ELMIRAPA-C

## 2011-12-06 NOTE — Progress Notes (Signed)
Regular Periods: yes Mammogram: no  Monthly Breast Ex.: yes Exercise: yes  Tetanus < 10 years: yes Seatbelts: yes  NI. Bladder Functn.: yes Abuse at home: no  Daily BM's: yes Stressful Work: no  Healthy Diet: yes Sigmoid-Colonoscopy: no   Calcium: no Medical problems this year: no problems   LAST PAP:5/12  Contraception: junel 1/20 fe  Mammogram:  no  PCP: dr. Allyne Gee  PMH: no change  FMH: no change  Last Bone Scan: no

## 2011-12-07 ENCOUNTER — Telehealth: Payer: Self-pay

## 2011-12-07 LAB — HIV ANTIBODY (ROUTINE TESTING W REFLEX): HIV: NONREACTIVE

## 2011-12-07 LAB — VITAMIN D 25 HYDROXY (VIT D DEFICIENCY, FRACTURES): Vit D, 25-Hydroxy: 25 ng/mL — ABNORMAL LOW (ref 30–89)

## 2011-12-07 NOTE — Telephone Encounter (Signed)
Message copied by Winfred Leeds on Fri Dec 07, 2011  2:33 PM ------      Message from: Henreitta Leber      Created: Fri Dec 07, 2011 12:44 PM       Please perform vitamin D protocol.  Thanks,   EP

## 2011-12-07 NOTE — Telephone Encounter (Signed)
CALL IN RX FOR PT

## 2011-12-07 NOTE — Telephone Encounter (Signed)
Tc to pt regarding vit d. Informed pt that her vit d level was abnl. And per EP ;need to call in a rx for pt. Went over vit d protocol with pt and will send in rx  For vit d 50,000 units 1 cap. 1 x weekly for 12 # 28 with no refills to pt pharm. Pt  Voiced understanding

## 2011-12-10 ENCOUNTER — Telehealth: Payer: Self-pay | Admitting: Obstetrics and Gynecology

## 2011-12-10 NOTE — Telephone Encounter (Signed)
EP patient/med. verif.

## 2011-12-10 NOTE — Telephone Encounter (Signed)
Spoke with pt RGD MSG pt switching bc from nuvaring to bc pills wants to know hen to start bc pills advised pt can start bc pills whenever she takes nuvaring out start immediately also advised pt use bum for first pack of pills pt voice understanding

## 2011-12-11 LAB — PAP IG, CT-NG, RFX HPV ASCU
Chlamydia Probe Amp: NEGATIVE
GC Probe Amp: NEGATIVE

## 2011-12-21 ENCOUNTER — Ambulatory Visit: Payer: BC Managed Care – PPO | Admitting: Obstetrics and Gynecology

## 2011-12-24 ENCOUNTER — Ambulatory Visit: Payer: BC Managed Care – PPO | Admitting: Obstetrics and Gynecology

## 2011-12-28 ENCOUNTER — Other Ambulatory Visit: Payer: Self-pay | Admitting: Obstetrics and Gynecology

## 2011-12-28 NOTE — Telephone Encounter (Signed)
Tc from pharmacy wants clarity on rx states rx for junel wants to know if can substitute with fem con advised yes per protocol

## 2012-01-28 ENCOUNTER — Telehealth: Payer: Self-pay

## 2012-01-28 NOTE — Telephone Encounter (Signed)
Spoke with pt rgd msg pt wants to switch birth control due to insurance pt was on nuvaring stop nuvaring on Saturday no recent intercourse per pt pt wants ,icrogestin fe or loestrin 1/20 advised pt will consult with ND and call her back pt voice understanding

## 2012-01-28 NOTE — Telephone Encounter (Signed)
Spoke with [name] msg Murillo states need to switch bc that is covered under her insurance advised Murillo can contact insurance and see whats covered and give [place] a call back Murillo voice understanding

## 2012-01-31 NOTE — Telephone Encounter (Signed)
Patient may have Lo Estrin 1/20.  Take one tablet each day.  She may have refills until her annual

## 2012-01-31 NOTE — Telephone Encounter (Signed)
Lm on vm rx sent to pharm 

## 2012-03-05 ENCOUNTER — Other Ambulatory Visit: Payer: Self-pay | Admitting: Obstetrics and Gynecology

## 2012-03-05 NOTE — Telephone Encounter (Signed)
LM FOR PT TO CALL BACK. 

## 2012-03-06 MED ORDER — NORETHIN ACE-ETH ESTRAD-FE 1-20 MG-MCG PO TABS: ORAL_TABLET | ORAL | Status: DC

## 2012-03-06 NOTE — Telephone Encounter (Signed)
Spoke with pt rgd msg pt need refill on bc because she is doing it continuously pharm states no refills left on rx advised pt will send rx to pharm stating using continuously pt voice understanding per protocol rx sent to pharm

## 2012-08-18 ENCOUNTER — Telehealth: Payer: Self-pay | Admitting: Obstetrics and Gynecology

## 2012-08-19 ENCOUNTER — Encounter: Payer: Self-pay | Admitting: Family Medicine

## 2012-08-19 ENCOUNTER — Ambulatory Visit: Payer: 59 | Admitting: Family Medicine

## 2012-08-19 VITALS — BP 100/60 | Ht 64.0 in | Wt 159.0 lb

## 2012-08-19 DIAGNOSIS — Z3202 Encounter for pregnancy test, result negative: Secondary | ICD-10-CM

## 2012-08-19 DIAGNOSIS — Z139 Encounter for screening, unspecified: Secondary | ICD-10-CM

## 2012-08-19 DIAGNOSIS — N912 Amenorrhea, unspecified: Secondary | ICD-10-CM

## 2012-08-19 LAB — POCT URINE PREGNANCY: Preg Test, Ur: NEGATIVE

## 2012-08-19 NOTE — Progress Notes (Signed)
S: Patient reports "if it happens" trying to conceive.  LMP 07/12/2012, 2 home PT were negative.  Stopped pills in October.  Menses started on 11/19, 12/24 , and 1/25.  Patient reports a change in temperature extremes (heat/cold), no changes in hair or facial hair, has noticed facial acne.  Hx of constipation, no changes in bowel, no changes in skin.  Nausea, but no vomiting. No nipple discharge.  Reports a increase in weight and that's why she stopped her pills.  No new stressors, started exercising in January (2-3x week), but stop beginning of February.  No frequent headaches or vision chanes.  No changes in medications  O: Last AEX 11/2011.  Ears consistant with head.  Facial acne.  Lungs CTAB.  Heart: normal S1+S2 without M/G/R.  Neck supple, no thyromegaly noted.  Deferred pelvic exam today.      A: UPT negative  P: 1. Had discussion at length with patient on possible reasons for changes in menses.  Discussed thyroid dysfunction, change in exercise, normal change in menses, which could change up to twice a year and other causes including her history of oophorectomy.  Discussed with patient may wait 3 months if no menses can do a work-up which includes blood test (TSH, prolactin, FSH, LH) pelvic exam with pelvic ultrasound and give medications to start menses.    2. Discussed infertility begins 1 year after trying to conceive without contraception, if she would like a fertility work-up we can do that with above labs.  Patient to talk with partner, call the office in 3 months.

## 2012-08-19 NOTE — Patient Instructions (Signed)
Secondary Amenorrhea   Secondary amenorrhea is the stopping of menstrual flow for 3 to 6 months in a female who has previously had periods. There are many possible causes. Most of these causes are not serious. Usually treating the underlying problem causing the loss of menses will return your periods to normal.  CAUSES   Some common and uncommon causes of not menstruating include:  · Malnutrition.  · Low blood sugar (hypoglycemia).  · Polycystic ovarian disease.  · Stress or fear.  · Breastfeeding.  · Hormone imbalance.  · Ovarian failure.  · Medications.  · Extreme obesity.  · Cystic fibrosis.  · Low body weight or drastic weight reduction from any cause.  · Early menopause.  · Removal of ovaries or uterus.  · Contraceptives.  · Illness.  · Long term (chronic) illnesses.  · Cushing's syndrome.  · Thyroid problems.  · Birth control pills, patches, or vaginal rings for birth control.  DIAGNOSIS   This diagnosis is made by your caregiver taking a medical history and doing a physical exam. Pregnancy must be ruled out. Often times, numerous blood tests of different hormones in the body may be measured. Urine testing may be done. Specialized x-rays may have to be done as well as measuring the body mass index (BMI).  TREATMENT   Treatment depends on the cause of the amenorrhea. If an eating disorder is present, this can be treated with an adequate diet and therapy. Chronic illnesses may improve with treatment of the illness. Overall, the outlook is good. The amenorrhea may be corrected with medications, lifestyle changes, or surgery. If the amenorrhea cannot be corrected, it is sometimes possible to create a false menstruation with medications.  Document Released: 07/16/2006 Document Revised: 08/27/2011 Document Reviewed: 05/23/2007  [employer]® Patient Information ©2013 [employer].

## 2013-12-23 ENCOUNTER — Other Ambulatory Visit: Payer: Self-pay | Admitting: Obstetrics and Gynecology

## 2013-12-23 ENCOUNTER — Other Ambulatory Visit (HOSPITAL_COMMUNITY)
Admission: RE | Admit: 2013-12-23 | Discharge: 2013-12-23 | Disposition: A | Discharge status: Home / Self Care | Payer: 59 | Source: Ambulatory Visit | Attending: Obstetrics and Gynecology | Admitting: Obstetrics and Gynecology

## 2013-12-23 DIAGNOSIS — Z1151 Encounter for screening for human papillomavirus (HPV): Secondary | ICD-10-CM | POA: Insufficient documentation

## 2013-12-23 DIAGNOSIS — Z01419 Encounter for gynecological examination (general) (routine) without abnormal findings: Secondary | ICD-10-CM | POA: Insufficient documentation

## 2013-12-24 LAB — CYTOLOGY - PAP

## 2014-01-22 ENCOUNTER — Other Ambulatory Visit: Payer: Self-pay | Admitting: Obstetrics and Gynecology

## 2014-01-22 DIAGNOSIS — N809 Endometriosis, unspecified: Secondary | ICD-10-CM

## 2014-01-22 DIAGNOSIS — R1900 Intra-abdominal and pelvic swelling, mass and lump, unspecified site: Secondary | ICD-10-CM

## 2014-01-27 ENCOUNTER — Other Ambulatory Visit: Payer: 59

## 2014-01-28 ENCOUNTER — Ambulatory Visit
Admission: RE | Admit: 2014-01-28 | Discharge: 2014-01-28 | Disposition: A | Discharge status: Home / Self Care | Payer: 59 | Source: Ambulatory Visit | Attending: Obstetrics and Gynecology | Admitting: Obstetrics and Gynecology

## 2014-01-28 DIAGNOSIS — R1900 Intra-abdominal and pelvic swelling, mass and lump, unspecified site: Secondary | ICD-10-CM

## 2014-01-28 DIAGNOSIS — N809 Endometriosis, unspecified: Secondary | ICD-10-CM

## 2014-01-28 MED ORDER — IOHEXOL 300 MG/ML  SOLN
100.0000 mL | Freq: Once | INTRAMUSCULAR | Status: AC | PRN
  Administered 2014-01-28: 100 mL via INTRAVENOUS

## 2014-02-17 ENCOUNTER — Encounter: Payer: Self-pay | Admitting: Primary Care

## 2014-02-17 ENCOUNTER — Other Ambulatory Visit: Payer: Self-pay | Admitting: Gastroenterology

## 2014-03-07 HISTORY — PX: COLONOSCOPY: SHX174

## 2014-04-19 ENCOUNTER — Encounter: Payer: Self-pay | Admitting: Family Medicine

## 2016-08-25 ENCOUNTER — Emergency Department
Admission: EM | Admit: 2016-08-25 | Discharge: 2016-08-25 | Disposition: A | Discharge status: Home / Self Care | Payer: 59 | Attending: Student in an Organized Health Care Education/Training Program | Admitting: Student in an Organized Health Care Education/Training Program

## 2016-08-25 ENCOUNTER — Encounter: Payer: Self-pay | Admitting: Obstetrics and Gynecology

## 2016-08-25 DIAGNOSIS — R1033 Periumbilical pain: Secondary | ICD-10-CM | POA: Diagnosis present

## 2016-08-25 DIAGNOSIS — R110 Nausea: Secondary | ICD-10-CM | POA: Insufficient documentation

## 2016-08-25 LAB — URINALYSIS, COMPLETE (UACMP) WITH MICROSCOPIC
BILIRUBIN URINE: NEGATIVE
GLUCOSE, UA: NEGATIVE mg/dL
KETONES UR: NEGATIVE mg/dL
LEUKOCYTES UA: NEGATIVE
NITRITE: NEGATIVE
Protein, ur: NEGATIVE mg/dL
Specific Gravity, Urine: 1.016 (ref 1.005–1.030)
pH: 7 (ref 5.0–8.0)

## 2016-08-25 LAB — COMPREHENSIVE METABOLIC PANEL
ALK PHOS: 40 U/L (ref 38–126)
ALT: 14 U/L (ref 14–54)
ANION GAP: 6 (ref 5–15)
AST: 20 U/L (ref 15–41)
Albumin: 3.8 g/dL (ref 3.5–5.0)
BILIRUBIN TOTAL: 0.4 mg/dL (ref 0.3–1.2)
BUN: 9 mg/dL (ref 6–20)
CALCIUM: 9.2 mg/dL (ref 8.9–10.3)
CO2: 25 mmol/L (ref 22–32)
Chloride: 109 mmol/L (ref 101–111)
Creatinine, Ser: 0.55 mg/dL (ref 0.44–1.00)
Glucose, Bld: 116 mg/dL — ABNORMAL HIGH (ref 65–99)
Potassium: 3.7 mmol/L (ref 3.5–5.1)
SODIUM: 140 mmol/L (ref 135–145)
TOTAL PROTEIN: 7.6 g/dL (ref 6.5–8.1)

## 2016-08-25 LAB — CBC
HCT: 37 % (ref 35.0–47.0)
Hemoglobin: 12.8 g/dL (ref 12.0–16.0)
MCH: 33.7 pg (ref 26.0–34.0)
MCHC: 34.7 g/dL (ref 32.0–36.0)
MCV: 97.2 fL (ref 80.0–100.0)
Platelets: 324 10*3/uL (ref 150–440)
RBC: 3.81 MIL/uL (ref 3.80–5.20)
RDW: 12.7 % (ref 11.5–14.5)
WBC: 7.2 10*3/uL (ref 3.6–11.0)

## 2016-08-25 LAB — LIPASE, BLOOD: Lipase: 29 U/L (ref 11–51)

## 2016-08-25 MED ORDER — PROMETHAZINE HCL 12.5 MG PO TABS: 12.5000 mg | ORAL_TABLET | Freq: Four times a day (QID) | ORAL | 0 refills | Status: DC | PRN

## 2016-08-25 MED ORDER — DICYCLOMINE HCL 10 MG PO CAPS: 10.0000 mg | ORAL_CAPSULE | Freq: Three times a day (TID) | ORAL | 0 refills | Status: DC | PRN

## 2016-08-25 NOTE — ED Triage Notes (Signed)
Pt presents to the ED with upper right quadrant pain and states she can feel a "lump" of some sort around the umbilicus. She says she feels nauseous after she eats on occasion and has had a benign mass before.   Pt states when she tries to have a bowel movement it is "just not normal" and she has started taking stool softeners this week and has had 2-3 Bms.  Patient states she is on birth control Junell

## 2016-08-25 NOTE — Discharge Instructions (Signed)

## 2016-08-25 NOTE — ED Notes (Signed)
Pt c/o mass and dull cramping pain at umbilicus epigastric, feeling of "fullness" for one week, pt on BC "Juno" for regulation, LMP middle of Jan, next predicted for April, last BM today "normal" Pt with hx of endometriosis, left mass and s/p removal of left ovary and fallopian tube,

## 2016-08-25 NOTE — ED Provider Notes (Signed)
[facility] Emergency Department Provider Note    First MD Initiated Contact with Patient 08/25/16 2146     (approximate)  I have reviewed the triage vital signs and the nursing notes.   HISTORY  Chief Complaint Abdominal Pain    HPI Christine Murillo is a 36 y.o. female who presents with periumbilical "slump" and nausea for the past several days. No vomiting. No chest pain. Normal bowel movements. Pain is not worsened with eating. Does have a history of ovarian cyst removal as well as endometriosis. Denies any chance of being pregnant. Denies any fevers.   Past Medical History:  Diagnosis Date  . Constipation   . Endometriosis 2013  . Other and unspecified ovarian cysts 2002   Hx of left ovary borderline cancinoma with removal  . Vitamin D deficiency    Family History  Problem Relation Age of Onset  . Hypertension Mother   . Cancer Maternal Grandmother   . Cancer Paternal Grandmother     ovarian   Past Surgical History:  Procedure Laterality Date  . DIAGNOSTIC LAPAROSCOPY  2002   removal l ovarian mass  . DILATION AND CURETTAGE OF UTERUS  2003  . TRANSURETHRAL RESECTION OF BLADDER TUMOR  07/23/2011   Procedure: TRANSURETHRAL RESECTION OF BLADDER TUMOR (TURBT);  Surgeon: Claybon Jabs, MD;  Location: [facility];  Service: Urology;  Laterality: N/A;   Patient Active Problem List   Diagnosis Date Noted  . Other and unspecified ovarian cysts   . 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 Authorizing Provider  bifidobacterium infantis (ALIGN) capsule Take 1 capsule by mouth daily.    Historical Provider, MD  cholecalciferol (VITAMIN D) 1000 UNITS tablet Take 1,000 Units by mouth 2 (two) times daily.    Historical Provider, MD  dicyclomine (BENTYL) 10 MG capsule Take 1 capsule (10 mg total) by mouth 3 (three) times daily as needed for  spasms. 08/25/16 09/08/16  Merlyn Lot, MD  etonogestrel-ethinyl estradiol (NUVARING) 0.12-0.015 MG/24HR vaginal ring Insert vaginally and leave in place for 3 consecutive weeks, then remove for 1 week. 12/06/11 12/05/12  Earnstine Regal, PA-C  Hydrocodone-Acetaminophen (VICODIN HP) 10-300 MG TABS Take 1-2 tablets by mouth every 6 (six) hours as needed. 07/23/11   Kathie Rhodes, MD  norethindrone-ethinyl estradiol (JUNEL FE 1/20) 1-20 MG-MCG tablet Take 1 tablet by mouth daily. 11/02/11 11/01/12  Crawford Givens, MD  norethindrone-ethinyl estradiol (JUNEL FE,GILDESS FE,LOESTRIN FE) 1-20 MG-MCG tablet One tab daily use continuously 03/06/12   Crawford Givens, MD  promethazine (PHENERGAN) 12.5 MG tablet Take 1 tablet (12.5 mg total) by mouth every 6 (six) hours as needed for nausea or vomiting. 08/25/16   Merlyn Lot, MD    Allergies Patient has no known allergies.    Social History Social History  Substance Use Topics  . Smoking status: Never Smoker  . Smokeless tobacco: Never Used  . Alcohol use Yes     Comment: socially    Review of Systems Patient denies headaches, rhinorrhea, blurry vision, numbness, shortness of breath, chest pain, edema, cough, abdominal pain, nausea, vomiting, diarrhea, dysuria, fevers, rashes or hallucinations unless otherwise stated above in HPI. ____________________________________________   PHYSICAL EXAM:  VITAL SIGNS: Vitals:   08/25/16 1821 08/25/16 2158  BP: 119/63 136/82  Pulse: 77 71  Resp: 16 18  Temp: 98.9 F (37.2 C)     Constitutional: Alert and  oriented. Well appearing and in no acute distress. Eyes: Conjunctivae are normal. PERRL. EOMI. Head: Atraumatic. Nose: No congestion/rhinnorhea. Mouth/Throat: Mucous membranes are moist.  Oropharynx non-erythematous. Neck: No stridor. Painless ROM. No cervical spine tenderness to palpation Hematological/Lymphatic/Immunilogical: No cervical lymphadenopathy. Cardiovascular: Normal rate, regular rhythm.  Grossly normal heart sounds.  Good peripheral circulation. Respiratory: Normal respiratory effort.  No retractions. Lungs CTAB. Gastrointestinal: Soft and nontender. Minimally tender 1cm mobile  mass at 10 oclock 1cm from umbilicus.  No overyling erythema.  Normoactive bs.  No distention. No abdominal bruits. No CVA tenderness. Musculoskeletal: No lower extremity tenderness nor edema.  No joint effusions. Neurologic:  Normal speech and language. No gross focal neurologic deficits are appreciated. No gait instability. Skin:  Skin is warm, dry and intact. No rash noted. Psychiatric: Mood and affect are normal. Speech and behavior are normal.  ____________________________________________   LABS (all labs ordered are listed, but only abnormal results are displayed)  Results for orders placed or performed during the hospital encounter of 08/25/16 (from the past 24 hour(s))  Lipase, blood     Status: None   Collection Time: 08/25/16  6:18 PM  Result Value Ref Range   Lipase 29 11 - 51 U/L  Comprehensive metabolic panel     Status: Abnormal   Collection Time: 08/25/16  6:18 PM  Result Value Ref Range   Sodium 140 135 - 145 mmol/L   Potassium 3.7 3.5 - 5.1 mmol/L   Chloride 109 101 - 111 mmol/L   CO2 25 22 - 32 mmol/L   Glucose, Bld 116 (H) 65 - 99 mg/dL   BUN 9 6 - 20 mg/dL   Creatinine, Ser 0.55 0.44 - 1.00 mg/dL   Calcium 9.2 8.9 - 10.3 mg/dL   Total Protein 7.6 6.5 - 8.1 g/dL   Albumin 3.8 3.5 - 5.0 g/dL   AST 20 15 - 41 U/L   ALT 14 14 - 54 U/L   Alkaline Phosphatase 40 38 - 126 U/L   Total Bilirubin 0.4 0.3 - 1.2 mg/dL   GFR calc non Af Amer >60 >60 mL/min   GFR calc Af Amer >60 >60 mL/min   Anion gap 6 5 - 15  CBC     Status: None   Collection Time: 08/25/16  6:18 PM  Result Value Ref Range   WBC 7.2 3.6 - 11.0 K/uL   RBC 3.81 3.80 - 5.20 MIL/uL   Hemoglobin 12.8 12.0 - 16.0 g/dL   HCT 37.0 35.0 - 47.0 %   MCV 97.2 80.0 - 100.0 fL   MCH 33.7 26.0 - 34.0 pg   MCHC 34.7  32.0 - 36.0 g/dL   RDW 12.7 11.5 - 14.5 %   Platelets 324 150 - 440 K/uL  Urinalysis, Complete w Microscopic     Status: Abnormal   Collection Time: 08/25/16  6:18 PM  Result Value Ref Range   Color, Urine YELLOW (A) YELLOW   APPearance HAZY (A) CLEAR   Specific Gravity, Urine 1.016 1.005 - 1.030   pH 7.0 5.0 - 8.0   Glucose, UA NEGATIVE NEGATIVE mg/dL   Hgb urine dipstick SMALL (A) NEGATIVE   Bilirubin Urine NEGATIVE NEGATIVE   Ketones, ur NEGATIVE NEGATIVE mg/dL   Protein, ur NEGATIVE NEGATIVE mg/dL   Nitrite NEGATIVE NEGATIVE   Leukocytes, UA NEGATIVE NEGATIVE   RBC / HPF 0-5 0 - 5 RBC/hpf   WBC, UA 0-5 0 - 5 WBC/hpf   Bacteria, UA RARE (A) NONE SEEN  Squamous Epithelial / LPF 0-5 (A) NONE SEEN   Mucous PRESENT    Amorphous Crystal PRESENT    ____________________________________________  EKG______________________________  RADIOLOGY   ____________________________________________   PROCEDURES  Procedure(s) performed:  Procedures    Critical Care performed: no ____________________________________________   INITIAL IMPRESSION / ASSESSMENT AND PLAN / ED COURSE  Pertinent labs & imaging results that were available during my care of the patient were reviewed by me and considered in my medical decision making (see chart for details).  DDX: hernia, mass, scar tissue,   Christine Murillo is a 36 y.o. who presents to the ED with periumbilical discomfort as described above.  Patient is AFVSS in ED. Exam as above. Given current presentation have considered the above differential.  Blood work is reassuring.  Abdominal exam is soft and benign.  Do no feel this is consistent with acute surgical process.  Not consistent with abscess,  Less likely hernia.  Do feel patient is clinically stable for symptomatic management with close outpatient follow-up with OB/GYN as well as primary care physician. Encouraged patient to return in 12-24 hrs. if symptoms not improved for repeat  abdominal exam as well as further evaluation.  Patient was able to tolerate PO and was able to ambulate with a steady gait.  Have discussed with the patient and available family all diagnostics and treatments performed thus far and all questions were answered to the best of my ability. The patient demonstrates understanding and agreement with plan.       ____________________________________________   FINAL CLINICAL IMPRESSION(S) / ED DIAGNOSES  Final diagnoses:  Periumbilical abdominal pain  Nausea      NEW MEDICATIONS STARTED DURING THIS VISIT:  Discharge Medication List as of 08/25/2016 10:27 PM    START taking these medications   Details  dicyclomine (BENTYL) 10 MG capsule Take 1 capsule (10 mg total) by mouth 3 (three) times daily as needed for spasms., Starting Sat 08/25/2016, Until Sat 09/08/2016, Print    promethazine (PHENERGAN) 12.5 MG tablet Take 1 tablet (12.5 mg total) by mouth every 6 (six) hours as needed for nausea or vomiting., Starting Sat 08/25/2016, Print         Note:  This document was prepared using Dragon voice recognition software and may include unintentional dictation errors.    Merlyn Lot, MD 08/26/16 [phone]

## 2017-04-03 ENCOUNTER — Telehealth: Payer: Self-pay | Admitting: *Deleted

## 2017-04-03 NOTE — Telephone Encounter (Signed)
Spoke with a nurse at [facility] with Dr. Royston Sinner, gave new patient appt. Per nurse the patient had history of ovarian ca at the age of 51, and had no follow up. Per nurse Dr. Royston Sinner wants the patient to have a follow up with  GYN/ONC.

## 2017-04-29 ENCOUNTER — Ambulatory Visit: Payer: 59 | Attending: Gynecologic Oncology | Admitting: Gynecologic Oncology

## 2017-04-29 ENCOUNTER — Encounter: Payer: Self-pay | Admitting: Gynecologic Oncology

## 2017-04-29 VITALS — BP 138/60 | HR 63 | Temp 98.6°F | Resp 18 | Ht 66.0 in | Wt 147.5 lb

## 2017-04-29 DIAGNOSIS — N801 Endometriosis of ovary: Secondary | ICD-10-CM | POA: Diagnosis not present

## 2017-04-29 DIAGNOSIS — D0739 Carcinoma in situ of other female genital organs: Secondary | ICD-10-CM | POA: Diagnosis not present

## 2017-04-29 DIAGNOSIS — D489 Neoplasm of uncertain behavior, unspecified: Secondary | ICD-10-CM

## 2017-04-29 DIAGNOSIS — Z8543 Personal history of malignant neoplasm of ovary: Secondary | ICD-10-CM | POA: Diagnosis present

## 2017-04-29 DIAGNOSIS — C569 Malignant neoplasm of unspecified ovary: Secondary | ICD-10-CM

## 2017-04-29 NOTE — Progress Notes (Signed)
Consult Note: Gyn-Onc  Consult was requested by Dr. Lucillie Garfinkel for the evaluation of Gavin Pound 36 y.o. female  CC:  Chief Complaint  Patient presents with  . Mucinous tumor, of low malignant potential    Assessment/Plan:  Ms. BRITTLYN CLOE  is a 36 y.o.  year old with a history of left ovarian LMP with intraepithelial carcinoma at age 36.  I discussed with Ms Drzewiecki that intraepithelial carcinoma without desmoplastic stromal invasion and therefore this is considered a preinvasive lesion. The recommendations for low malignant potential tumor in the updated NCCN guidelines include annual surveillance so long as a patient has a uterus, and ovaries. It is recommended she have an RSO when she has completed childbearing and has neared menopause.  We will set her up for an appointment with genetics to discuss BRCA testing. If this is positive, we would recommend surgery sooner rather than later.  With respect to the current cyst and CA 125 elevation, I feel that this is most likely benign. I agree with Dr Elaina Pattee plan to repeat imaging at 8 weeks. If this is consistently growing and becoming more complex, she would be a good candidate for an ovarian cystectomy for definitive pathology. If that was benign, she could continue with surveillance. If that showed recurrent LMP, she could be followed, vs proceed with completion surgery (she should have oncofertility counseling prior to proceeding with definitive surgery).   With respect to chemoprophylaxis, I agree that the OCP's are ideal. They will also help with endometriosis symptoms. If she doesn't like the weight gain symptoms, an alternative might be Mirena IUD. While we have less data regarding ovarian cancer prophylaxis, there is still some measured benefit from continued progestins with ovarian cancer risk.   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  Her history dates back to November 2002 when at the age of 19 she underwent a left salpingo-oophorectomy. 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  She has been observed since and did well until 2011 when she developed severe dysmenorrhea.   Associated with that dysmenorrhea was severe GI upset bloating nausea, vomiting lower abdominal crampy pain that radiates to the flank. She was seen by a gastroenterologist and a CT scan of the abdomen and pelvis was collected on 06/14/2011. The findings were notable for normal bowel gas pattern uterus was unremarkable slight right adnexal fullness was appreciated but most notably there was a 3.2 x 3.4 x 2.4 cm superior bladder wall mass. There was no evidence of pelvic lymphadenopathy and interestingly the report stated that peritoneal nodularity had resolved which suggest prior peritoneal nodularity was present.  She was evaluated by Dr. Kathie Rhodes and underwent transurethral cystoscopy the findings were notable for multiple cystic areas filled with old blood in the appearance of chocolate cysts within the bladder wall.  One of the bladder lesions was removed and the pathology was consistent with polypoid urethral mucosa with hyperemia and focal hemosiderin deposition no evidence of malignancy.  The patient was started on Loestrin administered in a continuous fashion with resolution of the dysmenorrhea and associated symptomatology.   She stopped her OCP in 2017 because she felt that it was associated with weight gain.  She saw Dr Royston Sinner for routine gynecologic care in October, 2018. TVUS  at that time showed a 5x2.9cm yst with septations and internal echoes, no flow seen to cyst (probably hemorrhagic). There was also a 2.7cm simple cyst. CA 125 on 04/07/17 was elevated at 62.   Current Meds:  Outpatient Encounter  Medications as of 04/29/2017  Medication Sig  . bifidobacterium infantis (ALIGN) capsule Take 1 capsule by mouth daily.  . cholecalciferol (VITAMIN D) 1000 UNITS tablet Take 1,000 Units by mouth 2 (two) times daily.  . Multiple Vitamin (MULTIVITAMIN) capsule Take 1 capsule daily by mouth.  . norethindrone-ethinyl estradiol (JUNEL FE,GILDESS FE,LOESTRIN FE) 1-20 MG-MCG tablet One tab daily use continuously  . [DISCONTINUED] dicyclomine (BENTYL) 10 MG capsule Take 1 capsule (10 mg total) by mouth 3 (three) times daily as needed for spasms.  . [DISCONTINUED] etonogestrel-ethinyl estradiol (NUVARING) 0.12-0.015 MG/24HR vaginal ring Insert vaginally and leave in place for 3 consecutive weeks, then remove for 1 week.  . [DISCONTINUED] Hydrocodone-Acetaminophen (VICODIN HP) 10-300 MG TABS Take 1-2 tablets by mouth every 6 (six) hours as needed.  . [DISCONTINUED] norethindrone-ethinyl estradiol (JUNEL FE 1/20) 1-20 MG-MCG tablet Take 1 tablet by mouth daily.  . [DISCONTINUED] promethazine (PHENERGAN) 12.5 MG tablet Take 1 tablet (12.5 mg total) by mouth every 6 (six) hours as needed for nausea or vomiting.   No facility-administered encounter medications on file as of 04/29/2017.     Allergy: No Known Allergies  Social Hx:   Social History   Socioeconomic History  . Marital status: Single    Spouse name: Not on file  . Number of children: Not on file  . Years of education: Not on file  . Highest education level: Not on file  Social Needs  . Financial resource strain: Not on file  . Food insecurity - worry: Not on file  . Food insecurity - inability: Not on file  . Transportation needs - medical: Not on file  . Transportation needs - non-medical: Not on file  Occupational History  . Not on file  Tobacco Use  . Smoking status: Never Smoker  . Smokeless tobacco: Never Used  Substance and Sexual Activity  . Alcohol use: Yes    Comment: socially  . Drug use: No  . Sexual activity: Yes     Birth control/protection: None  Other Topics Concern  . Not on file  Social History Narrative  . Not on file    Past Surgical Hx:  Past Surgical History:  Procedure Laterality Date  . DIAGNOSTIC LAPAROSCOPY  2002   removal l ovarian mass  . DILATION AND CURETTAGE OF UTERUS  2003    Past Medical Hx:  Past Medical History:  Diagnosis Date  . Constipation   . Endometriosis 2013  . Other and unspecified ovarian cysts 2002   Hx of left ovary borderline cancinoma with removal  . Vitamin D deficiency     Past Gynecological History:  LMP left ovary at age 59 (s/p LSO), endometriosis s/p cystoscopic resection of bladder mass (endometrioma). No LMP recorded. s/p miscarriage age 20.  Family Hx:  Family History  Problem Relation Age of Onset  . Hypertension Mother   . Cancer Paternal Grandmother        ovarian  . Cancer Maternal Grandmother     Review of Systems:  Constitutional  Feels well,    ENT Normal appearing ears and nares bilaterally Skin/Breast  No rash, sores, jaundice, itching, dryness Cardiovascular  No chest pain, shortness of breath, or edema  Pulmonary  No cough or wheeze.  Gastro Intestinal  No nausea, vomitting, or diarrhoea. No bright red blood per rectum, no abdominal pain, change in bowel movement, or constipation.  Genito Urinary  No frequency, urgency, dysuria, + spotting on birthcontrol pills Musculo Skeletal  No myalgia, arthralgia, joint swelling or pain  Neurologic  No weakness, numbness, change in gait,  Psychology  No depression, anxiety, insomnia.   Vitals:  Blood pressure 138/60, pulse 63, temperature 98.6 F (37 C), temperature source Oral, resp. rate 18, height _0  (1.676 m), weight 147 lb 8 oz (66.9 kg), SpO2 100 %.  Physical Exam: WD in NAD Neck  Supple NROM, without any enlargements.  Lymph Node Survey No cervical supraclavicular or inguinal adenopathy Cardiovascular  Pulse normal rate, regularity and rhythm. S1 and S2  normal.  Lungs  Clear to auscultation bilateraly, without wheezes/crackles/rhonchi. Good air movement.  Skin  No rash/lesions/breakdown  Psychiatry  Alert and oriented to person, place, and time  Abdomen  Normoactive bowel sounds, abdomen soft, non-tender and obese without evidence of hernia.  Back No CVA tenderness Genito Urinary  Vulva/vagina: Normal external female genitalia.  No lesions. No discharge or bleeding.  Bladder/urethra:  No lesions or masses, well supported bladder  Vagina: normal  Cervix: Normal appearing, no lesions.  Uterus: retroverted, minimally mobile, no parametrial involvement or nodularity.  Adnexa: no palpable masses. Rectal  Good tone, no masses no cul de sac nodularity.  Extremities  No bilateral cyanosis, clubbing or edema.   Donaciano Eva, MD  04/29/2017, 1:13 PM

## 2017-04-29 NOTE — Patient Instructions (Signed)
Dr Denman George recommends repeating the ultrasound in 8 weeks. If the ovarian cyst is growing it should be removed for evaluation.  If stable or decreasing, you can continue yearly exams with Dr Royston Sinner.

## 2017-05-15 ENCOUNTER — Telehealth: Payer: Self-pay | Admitting: Gynecologic Oncology

## 2017-05-15 NOTE — Telephone Encounter (Signed)
Spoke to patient regarding upcoming January 2019 appointments.  °

## 2017-06-26 ENCOUNTER — Other Ambulatory Visit: Payer: No Typology Code available for payment source

## 2017-06-26 ENCOUNTER — Encounter: Payer: No Typology Code available for payment source | Admitting: Genetic Counselor

## 2017-09-27 ENCOUNTER — Ambulatory Visit (INDEPENDENT_AMBULATORY_CARE_PROVIDER_SITE_OTHER): Payer: 59 | Admitting: Primary Care

## 2017-09-27 ENCOUNTER — Encounter: Payer: Self-pay | Admitting: Primary Care

## 2017-09-27 VITALS — BP 102/66 | HR 69 | Temp 98.6°F | Ht 65.25 in | Wt 151.8 lb

## 2017-09-27 DIAGNOSIS — N898 Other specified noninflammatory disorders of vagina: Secondary | ICD-10-CM

## 2017-09-27 DIAGNOSIS — R002 Palpitations: Secondary | ICD-10-CM | POA: Diagnosis not present

## 2017-09-27 DIAGNOSIS — K649 Unspecified hemorrhoids: Secondary | ICD-10-CM | POA: Diagnosis not present

## 2017-09-27 DIAGNOSIS — N809 Endometriosis, unspecified: Secondary | ICD-10-CM | POA: Diagnosis not present

## 2017-09-27 DIAGNOSIS — K625 Hemorrhage of anus and rectum: Secondary | ICD-10-CM

## 2017-09-27 LAB — CBC
HEMATOCRIT: 35.4 % — AB (ref 36.0–46.0)
HEMOGLOBIN: 12 g/dL (ref 12.0–15.0)
MCHC: 33.8 g/dL (ref 30.0–36.0)
MCV: 98.1 fl (ref 78.0–100.0)
PLATELETS: 325 10*3/uL (ref 150.0–400.0)
RBC: 3.61 Mil/uL — AB (ref 3.87–5.11)
RDW: 13 % (ref 11.5–15.5)
WBC: 5.1 10*3/uL (ref 4.0–10.5)

## 2017-09-27 LAB — POC URINALSYSI DIPSTICK (AUTOMATED)
BILIRUBIN UA: NEGATIVE
GLUCOSE UA: NEGATIVE
KETONES UA: NEGATIVE
LEUKOCYTES UA: NEGATIVE
Nitrite, UA: NEGATIVE
Protein, UA: NEGATIVE
Spec Grav, UA: 1.015 (ref 1.010–1.025)
Urobilinogen, UA: 0.2 E.U./dL
pH, UA: 6 (ref 5.0–8.0)

## 2017-09-27 LAB — TSH: TSH: 1.6 u[IU]/mL (ref 0.35–4.50)

## 2017-09-27 LAB — COMPREHENSIVE METABOLIC PANEL
ALBUMIN: 3.8 g/dL (ref 3.5–5.2)
ALK PHOS: 40 U/L (ref 39–117)
ALT: 12 U/L (ref 0–35)
AST: 13 U/L (ref 0–37)
BUN: 9 mg/dL (ref 6–23)
CALCIUM: 8.9 mg/dL (ref 8.4–10.5)
CHLORIDE: 107 meq/L (ref 96–112)
CO2: 24 mEq/L (ref 19–32)
Creatinine, Ser: 0.69 mg/dL (ref 0.40–1.20)
GFR: 103.01 mL/min (ref >60.00)
Glucose, Bld: 82 mg/dL (ref 70–99)
POTASSIUM: 3.7 meq/L (ref 3.5–5.1)
Sodium: 137 mEq/L (ref 135–145)
TOTAL PROTEIN: 7.4 g/dL (ref 6.0–8.3)
Total Bilirubin: 0.3 mg/dL (ref 0.2–1.2)

## 2017-09-27 LAB — POC HEMOCCULT BLD/STL (OFFICE/1-CARD/DIAGNOSTIC): Fecal Occult Blood, POC: NEGATIVE

## 2017-09-27 MED ORDER — HYDROCORTISONE ACETATE 25 MG RE SUPP: 25.0000 mg | Freq: Two times a day (BID) | RECTAL | 0 refills | Status: DC

## 2017-09-27 NOTE — Assessment & Plan Note (Signed)
Following with GYN. Continue OCP's. 

## 2017-09-27 NOTE — Assessment & Plan Note (Signed)
Noted during colonoscopy in 2015, internal.  Small internal hemorrhoid noticed on exam. Suspect this to be causing rectal bleeding.  Rx for Anusol suppositories sent to pharmacy for use. She will update if symptoms persist. Will request colonoscopy records.

## 2017-09-27 NOTE — Patient Instructions (Addendum)
We will notify you of your vaginal swab results once received.  Stop by the lab prior to leaving today. I will notify you of your results once received.   Use the suppositories twice daily for the hemorrhoids for 6 days. Please notify me if your symptoms persist.   It was a pleasure to meet you today! Please don't hesitate to call or message me with any questions. Welcome to [facility]!

## 2017-09-27 NOTE — Progress Notes (Signed)
Subjective:    Patient ID: Christine Murillo, female    DOB: 04/06/1981, 37 y.o.   MRN: [id number]  HPI  Christine Murillo is a 37 year old female who presents today to establish care and discuss the problems mentioned below. Will obtain old records. She underwent a health screening at work in February 2019 and was normal.   1) Rectal Bleeding: Intermittent for which she noticed one month ago. She notices bright red bleeding on the toilet paper sometimes after bowel movements. Her last episode was this week, then prior to that one month ago. She has bowel movements every other day on average, denies constipation. She has noticed some rectal discomfort today when sitting, sneezing, coughing.   Colonoscopy completed in 2015 as she has a history of endometriosis that was suspected to be near her colon. She had several polyps, internal hemorrhoids but otherwise her exam was unremarkable. She has not seen GI since 2015.   2) Endometriosis: Diagnosed in 2010. Currently managed on OCP's for which she's been taking intermittently for years with recently starting back in October 2018.She is following with GYN at [facility]. She underwent an ultrasound recently. She has noticed vaginal discharge and itching that began 3 months ago. During her menstrual cycle her symptoms are gone. She's using monistat OTC. She feels better when she's not on OCP's.   3) Palpitations: Intermittent feelings of shortness of breath, palpitations at rest that occurs twice weekly that will last  5 minutes. She thinks her BP is higher when on OCP's. She mentioned this to her GYN who told her that these were side effects of the OCP's as she only feels these symptoms while on OCP's.  Review of Systems  Constitutional: Negative for fatigue.  Eyes: Negative for visual disturbance.  Respiratory: Negative for shortness of breath.   Cardiovascular: Positive for palpitations.  Gastrointestinal: Positive for blood in stool and rectal  pain. Negative for abdominal pain, constipation and diarrhea.  Genitourinary: Positive for vaginal discharge. Negative for flank pain, frequency, hematuria and vaginal bleeding.       Vaginal itching  Skin: Negative for color change.  Neurological: Negative for dizziness.  Hematological: Negative for adenopathy.  Psychiatric/Behavioral: The patient is not nervous/anxious.        Past Medical History:  Diagnosis Date  . Constipation   . Endometriosis 2013  . Other and unspecified ovarian cysts 2002   Hx of left ovary borderline cancinoma with removal  . Vitamin D deficiency      Social History   Socioeconomic History  . Marital status: Single    Spouse name: Not on file  . Number of children: Not on file  . Years of education: Not on file  . Highest education level: Not on file  Occupational History  . Not on file  Social Needs  . Financial resource strain: Not on file  . Food insecurity:    Worry: Not on file    Inability: Not on file  . Transportation needs:    Medical: Not on file    Non-medical: Not on file  Tobacco Use  . Smoking status: Never Smoker  . Smokeless tobacco: Never Used  Substance and Sexual Activity  . Alcohol use: Yes    Comment: socially  . Drug use: No  . Sexual activity: Yes    Birth control/protection: None  Lifestyle  . Physical activity:    Days per week: Not on file    Minutes per session: Not on  file  . Stress: Not on file  Relationships  . Social connections:    Talks on phone: Not on file    Gets together: Not on file    Attends religious service: Not on file    Active member of club or organization: Not on file    Attends meetings of clubs or organizations: Not on file    Relationship status: Not on file  . Intimate partner violence:    Fear of current or ex partner: Not on file    Emotionally abused: Not on file    Physically abused: Not on file    Forced sexual activity: Not on file  Other Topics Concern  . Not on file    Social History Narrative   Single.   No children.   Works for [employer].   Enjoys baking, shopping.     Past Surgical History:  Procedure Laterality Date  . DIAGNOSTIC LAPAROSCOPY  2002   removal l ovarian mass  . DILATION AND CURETTAGE OF UTERUS  2003  . 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 Hypertension Mother   . Ovarian cancer Paternal Grandmother   . Cancer Maternal Grandmother     No Known Allergies  Current Outpatient Medications on File Prior to Visit  Medication Sig Dispense Refill  . cholecalciferol (VITAMIN D) 1000 UNITS tablet Take 1,000 Units by mouth 2 (two) times daily.    . Multiple Vitamin (MULTIVITAMIN) capsule Take 1 capsule daily by mouth.    . norethindrone-ethinyl estradiol (JUNEL FE,GILDESS FE,LOESTRIN FE) 1-20 MG-MCG tablet One tab daily use continuously 3 Package 2   No current facility-administered medications on file prior to visit.     BP 102/66   Pulse 69   Temp 98.6 F (37 C) (Oral)   Ht 5' 5.25" (1.657 m)   Wt 151 lb 12 oz (68.8 kg)   LMP 09/05/2017   SpO2 98%   BMI 25.06 kg/m    Objective:   Physical Exam  Constitutional: She appears well-nourished.  Neck: Neck supple.  Cardiovascular: Normal rate and regular rhythm.  Pulmonary/Chest: Effort normal and breath sounds normal.  Genitourinary: Rectal exam shows internal hemorrhoid. Rectal exam shows guaiac negative stool. There is no tenderness or lesion on the right labia. There is no tenderness or lesion on the left labia. Cervix exhibits discharge. Cervix exhibits no motion tenderness. No erythema in the vagina. No vaginal discharge found.  Genitourinary Comments: Mild thick whitish discharge  Skin: Skin is warm and dry.  Psychiatric: She has  a normal mood and affect.          Assessment & Plan:  Vaginal Itching:  Also with whitish discharge x 3 months. UA today: negative for leuks and nitrites. 1+ blood. Wet prep sent off for further testing. Exam with mild amount of whitish discharge, no foul smell. Otherwise normal. Await results.  Pleas Koch, NP

## 2017-09-27 NOTE — Assessment & Plan Note (Addendum)
Intermittent with SOB. Likely a side effect of OCP's, but will check TSH, CBC, and D-dimer to rule out other cause, especially since she is on OCP's.   ECG today: NSR with rate of 65. No ST elevation/depression, PAC/PVC.  Consider Holter Monitor if labs unremarkable.

## 2017-09-28 ENCOUNTER — Other Ambulatory Visit: Payer: Self-pay | Admitting: Primary Care

## 2017-09-28 DIAGNOSIS — B373 Candidiasis of vulva and vagina: Secondary | ICD-10-CM

## 2017-09-28 DIAGNOSIS — B9689 Other specified bacterial agents as the cause of diseases classified elsewhere: Secondary | ICD-10-CM

## 2017-09-28 DIAGNOSIS — N760 Acute vaginitis: Secondary | ICD-10-CM

## 2017-09-28 DIAGNOSIS — B3731 Acute candidiasis of vulva and vagina: Secondary | ICD-10-CM

## 2017-09-28 LAB — D-DIMER, QUANTITATIVE (NOT AT ARMC): D DIMER QUANT: 0.25 ug{FEU}/mL (ref <0.50)

## 2017-09-28 LAB — WET PREP BY MOLECULAR PROBE
CANDIDA SPECIES: DETECTED — AB
MICRO NUMBER: 90453736
SPECIMEN QUALITY: ADEQUATE
Trichomonas vaginosis: NOT DETECTED

## 2017-09-28 MED ORDER — METRONIDAZOLE 500 MG PO TABS: 500.0000 mg | ORAL_TABLET | Freq: Two times a day (BID) | ORAL | 0 refills | Status: DC

## 2017-09-28 MED ORDER — FLUCONAZOLE 150 MG PO TABS: ORAL_TABLET | ORAL | 0 refills | Status: DC

## 2017-10-03 ENCOUNTER — Encounter: Payer: No Typology Code available for payment source | Admitting: Genetic Counselor

## 2017-10-03 ENCOUNTER — Other Ambulatory Visit: Payer: No Typology Code available for payment source

## 2017-10-10 ENCOUNTER — Telehealth: Payer: Self-pay | Admitting: Gynecologic Oncology

## 2017-10-10 NOTE — Telephone Encounter (Signed)
Unable to reach patient - left message for pt to call back and r/s cancelled appt from 4/18.

## 2017-11-27 ENCOUNTER — Inpatient Hospital Stay: Payer: 59

## 2017-11-27 ENCOUNTER — Inpatient Hospital Stay: Payer: 59 | Attending: Genetic Counselor | Admitting: Genetic Counselor

## 2017-12-13 ENCOUNTER — Other Ambulatory Visit: Payer: Self-pay

## 2017-12-13 ENCOUNTER — Emergency Department
Admission: EM | Admit: 2017-12-13 | Discharge: 2017-12-13 | Disposition: A | Discharge status: Home / Self Care | Payer: 59 | Attending: Emergency Medicine | Admitting: Emergency Medicine

## 2017-12-13 DIAGNOSIS — K648 Other hemorrhoids: Secondary | ICD-10-CM | POA: Diagnosis present

## 2017-12-13 DIAGNOSIS — Z79899 Other long term (current) drug therapy: Secondary | ICD-10-CM | POA: Insufficient documentation

## 2017-12-13 MED ORDER — HYDROCORTISONE ACETATE 25 MG RE SUPP: 25.0000 mg | Freq: Two times a day (BID) | RECTAL | 0 refills | Status: AC

## 2017-12-13 NOTE — ED Triage Notes (Signed)
Pt c/o hemorrhoid pain since march and has been taking OTC meds with no relief.

## 2017-12-13 NOTE — ED Provider Notes (Signed)
[facility] Emergency Department Provider Note   ____________________________________________   First MD Initiated Contact with Patient 12/13/17 1305     (approximate)  I have reviewed the triage vital signs and the nursing notes.   HISTORY  Chief Complaint Hemorrhoids   HPI Christine Murillo is a 37 y.o. female is here with complaint of hemorrhoids for the last 3 months.  Patient states that she saw her PCP approximately 3 months ago was given a prescription however when she went to get it filled it was $200.  She has been using over-the-counter products without any relief.  Patient states that hemorrhoids are not external.  She denies any dark tarry stools but states in the past she has seen some bright red blood when wiping.  She states currently she is not having any difficulty with constipation.  She has in the past however.  She rates her pain as 7 out of 10.  Past Medical History:  Diagnosis Date  . Constipation   . Endometriosis 2013  . Other and unspecified ovarian cysts 2002   Hx of left ovary borderline cancinoma with removal  . Vitamin D deficiency     Patient Active Problem List   Diagnosis Date Noted  . Palpitations 09/27/2017  . Hemorrhoids 09/27/2017  . Endometriosis 09/27/2017  . Other and unspecified ovarian cysts   . 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 DIAGNOSTIC LAPAROSCOPY  2002   removal l ovarian mass  . DILATION AND CURETTAGE OF UTERUS  2003  . TRANSURETHRAL RESECTION OF BLADDER TUMOR  07/23/2011   Procedure: TRANSURETHRAL RESECTION OF BLADDER TUMOR (TURBT);  Surgeon: Claybon Jabs, MD;  Location: [facility];  Service: Urology;  Laterality: N/A;    Prior to Admission medications   Medication Sig Start Date End Date Taking? Authorizing Provider  cholecalciferol (VITAMIN D) 1000 UNITS tablet Take 1,000 Units by mouth 2 (two) times daily.     [provider]  fluconazole (DIFLUCAN) 150 MG tablet Take 1 tablet by mouth once, may repeat in 3 days if symptoms persist. 09/28/17   Pleas Koch, NP  hydrocortisone (ANUSOL-HC) 25 MG suppository Place 1 suppository (25 mg total) rectally every 12 (twelve) hours. 12/13/17 12/13/18  Johnn Hai, PA-C  metroNIDAZOLE (FLAGYL) 500 MG tablet Take 1 tablet (500 mg total) by mouth 2 (two) times daily. 09/28/17   Pleas Koch, NP  Multiple Vitamin (MULTIVITAMIN) capsule Take 1 capsule daily by mouth.    [provider]  norethindrone-ethinyl estradiol (JUNEL FE,GILDESS FE,LOESTRIN FE) 1-20 MG-MCG tablet One tab daily use continuously 03/06/12   Crawford Givens, MD    Allergies Patient has no known allergies.  Family History  Problem Relation Age of Onset  . Hypertension Mother   . Ovarian cancer Paternal Grandmother   . Cancer Maternal Grandmother     Social History Social History   Tobacco Use  . Smoking status: Never Smoker  . Smokeless tobacco: Never Used  Substance Use Topics  . Alcohol use: Yes    Comment: socially  . Drug use: No    Review of Systems Constitutional: No fever/chills Cardiovascular: Denies chest pain. Respiratory: Denies shortness of breath. Gastrointestinal: No abdominal pain.  No nausea, no vomiting.  No diarrhea.  Past history Constipation. Genitourinary: Negative for dysuria. Musculoskeletal: Negative for back pain. Skin: Negative for rash. Neurological: Negative for  focal weakness or numbness. ____________________________________________  PHYSICAL EXAM:  VITAL SIGNS: ED Triage Vitals [12/13/17 1255]  Enc Vitals Group     BP (!) 122/99     Pulse Rate 82     Resp 17     Temp 99.3 F (37.4 C)     Temp Source Oral     SpO2 99 %     Weight 150 lb (68 kg)     Height 5\' 6"  (1.676 m)     Head Circumference      Peak Flow      Pain Score 7     Pain Loc      Pain Edu?      Excl. in Media?    Constitutional: Alert  and oriented. Well appearing and in no acute distress. Eyes: Conjunctivae are normal.  Head: Atraumatic. Neck: No stridor.   Cardiovascular: Normal rate, regular rhythm. Grossly normal heart sounds.  Good peripheral circulation. Respiratory: Normal respiratory effort.  No retractions. Lungs CTAB. Gastrointestinal: Soft and nontender. No distention.  On rectal exam there is no external hemorrhoids noted.  No bleeding present.  No evidence of fissure. Musculoskeletal: No lower extremity tenderness nor edema.   Neurologic:  Normal speech and language. No gross focal neurologic deficits are appreciated. No gait instability. Skin:  Skin is warm, dry and intact. No rash noted. Psychiatric: Mood and affect are normal. Speech and behavior are normal.  ____________________________________________   LABS (all labs ordered are listed, but only abnormal results are displayed)  Labs Reviewed - No data to display   PROCEDURES  Procedure(s) performed: None  Procedures  Critical Care performed: No  ____________________________________________   INITIAL IMPRESSION / ASSESSMENT AND PLAN / ED COURSE  As part of my medical decision making, I reviewed the following data within the electronic MEDICAL RECORD NUMBER Notes from prior ED visits and [place] Controlled Substance Database  Patient was given the name of the gastroenterologist listed on call today.  She is to call that office to make an appointment.  Patient is instructed to obtain Colace to prevent constipation.  She was given a prescription for a generic Anusol HC 25 mg suppository.  She is instructed to take the coupon with her to [facility] which was the cheapest place in [place] today.  ____________________________________________   FINAL CLINICAL IMPRESSION(S) / ED DIAGNOSES  Final diagnoses:  Internal hemorrhoid     ED Discharge Orders        Ordered    hydrocortisone (ANUSOL-HC) 25 MG suppository  Every 12 hours     12/13/17 1343         Note:  This document was prepared using Dragon voice recognition software and may include unintentional dictation errors.    Johnn Hai, PA-C 12/13/17 1404    Arta Silence, MD 12/13/17 1424

## 2017-12-13 NOTE — Discharge Instructions (Addendum)
Call make an appointment with the gastroenterologist for further evaluation of your hemorrhoids.  Sitz bath's as needed for discomfort.  Colace over-the-counter as needed for constipation prevention.  Increase fiber.  On today's exam there is no thrombosed hemorrhoid that can be lanced.  This will require further investigation by your GI doctor.

## 2019-03-27 ENCOUNTER — Other Ambulatory Visit: Payer: Self-pay | Admitting: Primary Care

## 2019-03-27 DIAGNOSIS — Z0000 Encounter for general adult medical examination without abnormal findings: Secondary | ICD-10-CM

## 2019-03-31 ENCOUNTER — Other Ambulatory Visit (INDEPENDENT_AMBULATORY_CARE_PROVIDER_SITE_OTHER): Payer: 59

## 2019-03-31 DIAGNOSIS — Z0000 Encounter for general adult medical examination without abnormal findings: Secondary | ICD-10-CM | POA: Diagnosis not present

## 2019-03-31 LAB — LIPID PANEL
Cholesterol: 183 mg/dL (ref 0–200)
HDL: 67.4 mg/dL (ref >39.00)
LDL Cholesterol: 102 mg/dL — ABNORMAL HIGH (ref 0–99)
NonHDL: 115.88
Total CHOL/HDL Ratio: 3
Triglycerides: 67 mg/dL (ref 0.0–149.0)
VLDL: 13.4 mg/dL (ref 0.0–40.0)

## 2019-03-31 LAB — COMPREHENSIVE METABOLIC PANEL
ALT: 13 U/L (ref 0–35)
AST: 17 U/L (ref 0–37)
Albumin: 3.9 g/dL (ref 3.5–5.2)
Alkaline Phosphatase: 47 U/L (ref 39–117)
BUN: 8 mg/dL (ref 6–23)
CO2: 24 mEq/L (ref 19–32)
Calcium: 9 mg/dL (ref 8.4–10.5)
Chloride: 107 mEq/L (ref 96–112)
Creatinine, Ser: 0.7 mg/dL (ref 0.40–1.20)
GFR: 94.51 mL/min (ref >60.00)
Glucose, Bld: 85 mg/dL (ref 70–99)
Potassium: 4.3 mEq/L (ref 3.5–5.1)
Sodium: 137 mEq/L (ref 135–145)
Total Bilirubin: 0.5 mg/dL (ref 0.2–1.2)
Total Protein: 7.2 g/dL (ref 6.0–8.3)

## 2019-04-06 ENCOUNTER — Ambulatory Visit (INDEPENDENT_AMBULATORY_CARE_PROVIDER_SITE_OTHER): Payer: 59 | Admitting: Primary Care

## 2019-04-06 ENCOUNTER — Encounter: Payer: Self-pay | Admitting: Primary Care

## 2019-04-06 ENCOUNTER — Other Ambulatory Visit: Payer: Self-pay

## 2019-04-06 VITALS — BP 118/80 | HR 77 | Temp 97.6°F | Ht 66.0 in | Wt 166.2 lb

## 2019-04-06 DIAGNOSIS — N809 Endometriosis, unspecified: Secondary | ICD-10-CM

## 2019-04-06 DIAGNOSIS — K649 Unspecified hemorrhoids: Secondary | ICD-10-CM

## 2019-04-06 DIAGNOSIS — Z0000 Encounter for general adult medical examination without abnormal findings: Secondary | ICD-10-CM | POA: Diagnosis not present

## 2019-04-06 NOTE — Assessment & Plan Note (Signed)
Immunizations UTD. Pap smear UTD per patient, follows with GYN. Encouraged regular exercise, healthy diet.  Exam today unremarkable. Labs reviewed. Follow up in 1 year for CPE.

## 2019-04-06 NOTE — Progress Notes (Signed)
Subjective:    Patient ID: Christine Murillo, female    DOB: [birth year], 38 y.o.   MRN: [id number]  HPI  Christine Murillo is a 38 year old female who presents today for complete physical.  Immunizations: -Tetanus: Completed in 2015 -Influenza: Completed this season   Diet: She endorses a fair diet. Exercise: No recent exercise, can't go to the gym due to Covid-19.   Eye exam: Completed in 2020 Dental exam: Completes semi-annually  Pap Smear: Follows with GYN.   BP Readings from Last 3 Encounters:  04/06/19 118/80  12/13/17 (!) 122/99  09/27/17 102/66      Review of Systems  Constitutional: Negative for unexpected weight change.  HENT: Negative for rhinorrhea.   Respiratory: Negative for cough and shortness of breath.   Cardiovascular: Negative for chest pain.  Gastrointestinal: Negative for constipation and diarrhea.       Hemorrhoids   Genitourinary: Negative for difficulty urinating and menstrual problem.  Musculoskeletal: Negative for arthralgias and myalgias.  Skin: Negative for rash.  Allergic/Immunologic: Negative for environmental allergies.  Neurological: Negative for dizziness, numbness and headaches.  Psychiatric/Behavioral: The patient is not nervous/anxious.        Past Medical History:  Diagnosis Date  . Constipation   . Endometriosis 2013  . Other and unspecified ovarian cysts 2002   Hx of left ovary borderline cancinoma with removal  . Vitamin D deficiency      Social History   Socioeconomic History  . Marital status: Single    Spouse name: Not on file  . Number of children: Not on file  . Years of education: Not on file  . Highest education level: Not on file  Occupational History  . Not on file  Social Needs  . Financial resource strain: Not on file  . Food insecurity    Worry: Not on file    Inability: Not on file  . Transportation needs    Medical: Not on file    Non-medical: Not on file  Tobacco Use  . Smoking status: Never Smoker   . Smokeless tobacco: Never Used  Substance and Sexual Activity  . Alcohol use: Yes    Comment: socially  . Drug use: No  . Sexual activity: Yes    Birth control/protection: None  Lifestyle  . Physical activity    Days per week: Not on file    Minutes per session: Not on file  . Stress: Not on file  Relationships  . Social Herbalist on phone: Not on file    Gets together: Not on file    Attends religious service: Not on file    Active member of club or organization: Not on file    Attends meetings of clubs or organizations: Not on file    Relationship status: Not on file  . Intimate partner violence    Fear of current or ex partner: Not on file    Emotionally abused: Not on file    Physically abused: Not on file    Forced sexual activity: Not on file  Other Topics Concern  . Not on file  Social History Narrative   Single.   No children.   Works for [employer].   Enjoys baking, shopping.     Past Surgical History:  Procedure Laterality Date  . DIAGNOSTIC LAPAROSCOPY  2002   removal l ovarian mass  . DILATION AND CURETTAGE OF UTERUS  2003  . TRANSURETHRAL RESECTION OF BLADDER TUMOR  07/23/2011  Procedure: TRANSURETHRAL RESECTION OF BLADDER TUMOR (TURBT);  Surgeon: Claybon Jabs, MD;  Location: [facility];  Service: Urology;  Laterality: N/A;    Family History  Problem Relation Age of Onset  . Hypertension Mother   . Ovarian cancer Paternal Grandmother   . Cancer Maternal Grandmother     No Known Allergies  Current Outpatient Medications on File Prior to Visit  Medication Sig Dispense Refill  . cholecalciferol (VITAMIN D) 1000 UNITS tablet Take 1,000 Units by mouth 2 (two) times daily.    . Multiple Vitamin (MULTIVITAMIN) capsule Take 1 capsule daily by mouth.    . norethindrone-ethinyl estradiol (JUNEL FE,GILDESS FE,LOESTRIN FE) 1-20 MG-MCG tablet One tab daily use continuously 3 Package 2   No current facility-administered  medications on file prior to visit.     BP 118/80   Pulse 77   Temp 97.6 F (36.4 C) (Temporal)   Ht 5\' 6"  (1.676 m)   Wt 166 lb 4 oz (75.4 kg)   LMP 03/26/2019   SpO2 99%   BMI 26.83 kg/m    Objective:   Physical Exam  Constitutional: She is oriented to person, place, and time. She appears well-nourished.  HENT:  Right Ear: Tympanic membrane and ear canal normal.  Left Ear: Tympanic membrane and ear canal normal.  Mouth/Throat: Oropharynx is clear and moist.  Eyes: Pupils are equal, round, and reactive to light. EOM are normal.  Neck: Neck supple.  Cardiovascular: Normal rate and regular rhythm.  Respiratory: Effort normal and breath sounds normal.  GI: Soft. Bowel sounds are normal. There is no abdominal tenderness.  Musculoskeletal: Normal range of motion.  Neurological: She is alert and oriented to person, place, and time.  Skin: Skin is warm and dry.  Psychiatric: She has a normal mood and affect.           Assessment & Plan:

## 2019-04-06 NOTE — Assessment & Plan Note (Signed)
Continued.  Referral placed [facility] Michail Sermon, as she was there in 2015.

## 2019-04-06 NOTE — Patient Instructions (Signed)
Start exercising. You should be getting 150 minutes of moderate intensity exercise weekly.  It's important to improve your diet by reducing consumption of fast food, fried food, processed snack foods, sugary drinks. Increase consumption of fresh vegetables and fruits, whole grains, water.  Ensure you are drinking 64 ounces of water daily.  You will be contacted regarding your referral to GI.  Please let us know if you have not been contacted within one week.   It was a pleasure to see you today!   Preventive Care 20-63 Years Old, Female Preventive care refers to visits with your health care provider and lifestyle choices that can promote health and wellness. This includes:  A yearly physical exam. This may also be called an annual well check.  Regular dental visits and eye exams.  Immunizations.  Screening for certain conditions.  Healthy lifestyle choices, such as eating a healthy diet, getting regular exercise, not using drugs or products that contain nicotine and tobacco, and limiting alcohol use. What can I expect for my preventive care visit? Physical exam Your health care provider will check your:  Height and weight. This may be used to calculate body mass index (BMI), which tells if you are at a healthy weight.  Heart rate and blood pressure.  Skin for abnormal spots. Counseling Your health care provider may ask you questions about your:  Alcohol, tobacco, and drug use.  Emotional well-being.  Home and relationship well-being.  Sexual activity.  Eating habits.  Work and work Statistician.  Method of birth control.  Menstrual cycle.  Pregnancy history. What immunizations do I need?  Influenza (flu) vaccine  This is recommended every year. Tetanus, diphtheria, and pertussis (Tdap) vaccine  You may need a Td booster every 10 years. Varicella (chickenpox) vaccine  You may need this if you have not been vaccinated. Human papillomavirus (HPV)  vaccine  If recommended by your health care provider, you may need three doses over 6 months. Measles, mumps, and rubella (MMR) vaccine  You may need at least one dose of MMR. You may also need a second dose. Meningococcal conjugate (MenACWY) vaccine  One dose is recommended if you are age 6-21 years and a first-year college student living in a residence hall, or if you have one of several medical conditions. You may also need additional booster doses. Pneumococcal conjugate (PCV13) vaccine  You may need this if you have certain conditions and were not previously vaccinated. Pneumococcal polysaccharide (PPSV23) vaccine  You may need one or two doses if you smoke cigarettes or if you have certain conditions. Hepatitis A vaccine  You may need this if you have certain conditions or if you travel or work in places where you may be exposed to hepatitis A. Hepatitis B vaccine  You may need this if you have certain conditions or if you travel or work in places where you may be exposed to hepatitis B. Haemophilus influenzae type b (Hib) vaccine  You may need this if you have certain conditions. You may receive vaccines as individual doses or as more than one vaccine together in one shot (combination vaccines). Talk with your health care provider about the risks and benefits of combination vaccines. What tests do I need?  Blood tests  Lipid and cholesterol levels. These may be checked every 5 years starting at age 21.  Hepatitis C test.  Hepatitis B test. Screening  Diabetes screening. This is done by checking your blood sugar (glucose) after you have not eaten for a  while (fasting).  Sexually transmitted disease (STD) testing.  BRCA-related cancer screening. This may be done if you have a family history of breast, ovarian, tubal, or peritoneal cancers.  Pelvic exam and Pap test. This may be done every 3 years starting at age 47. Starting at age 79, this may be done every 5 years if  you have a Pap test in combination with an HPV test. Talk with your health care provider about your test results, treatment options, and if necessary, the need for more tests. Follow these instructions at home: Eating and drinking   Eat a diet that includes fresh fruits and vegetables, whole grains, lean protein, and low-fat dairy.  Take vitamin and mineral supplements as recommended by your health care provider.  Do not drink alcohol if: ? Your health care provider tells you not to drink. ? You are pregnant, may be pregnant, or are planning to become pregnant.  If you drink alcohol: ? Limit how much you have to 0-1 drink a day. ? Be aware of how much alcohol is in your drink. In the [place] one drink equals one 12 oz bottle of beer (355 mL), one 5 oz glass of wine (148 mL), or one 1 oz glass of hard liquor (44 mL). Lifestyle  Take daily care of your teeth and gums.  Stay active. Exercise for at least 30 minutes on 5 or more days each week.  Do not use any products that contain nicotine or tobacco, such as cigarettes, e-cigarettes, and chewing tobacco. If you need help quitting, ask your health care provider.  If you are sexually active, practice safe sex. Use a condom or other form of birth control (contraception) in order to prevent pregnancy and STIs (sexually transmitted infections). If you plan to become pregnant, see your health care provider for a preconception visit. What's next?  Visit your health care provider once a year for a well check visit.  Ask your health care provider how often you should have your eyes and teeth checked.  Stay up to date on all vaccines. This information is not intended to replace advice given to you by your health care provider. Make sure you discuss any questions you have with your health care provider. Document Released: 07/31/2001 Document Revised: 02/13/2018 Document Reviewed: 02/13/2018 Elsevier Patient Education  2020 [employer].

## 2019-04-06 NOTE — Assessment & Plan Note (Signed)
Following with GYN. Continue OCP's.

## 2019-04-07 ENCOUNTER — Other Ambulatory Visit: Payer: Self-pay | Admitting: Obstetrics and Gynecology

## 2019-04-07 ENCOUNTER — Other Ambulatory Visit (HOSPITAL_COMMUNITY)
Admission: RE | Admit: 2019-04-07 | Discharge: 2019-04-07 | Disposition: A | Discharge status: Home / Self Care | Payer: 59 | Source: Ambulatory Visit | Attending: Obstetrics and Gynecology | Admitting: Obstetrics and Gynecology

## 2019-04-07 DIAGNOSIS — Z124 Encounter for screening for malignant neoplasm of cervix: Secondary | ICD-10-CM | POA: Insufficient documentation

## 2019-04-13 LAB — CYTOLOGY - PAP
Comment: NEGATIVE
Diagnosis: NEGATIVE
High risk HPV: NEGATIVE

## 2019-04-17 ENCOUNTER — Other Ambulatory Visit: Payer: Self-pay | Admitting: Obstetrics and Gynecology

## 2019-04-17 DIAGNOSIS — K639 Disease of intestine, unspecified: Secondary | ICD-10-CM

## 2019-05-06 ENCOUNTER — Other Ambulatory Visit: Payer: No Typology Code available for payment source

## 2019-09-01 ENCOUNTER — Other Ambulatory Visit: Payer: Self-pay | Admitting: Primary Care

## 2019-09-03 ENCOUNTER — Other Ambulatory Visit: Payer: Self-pay

## 2019-09-03 ENCOUNTER — Ambulatory Visit: Payer: 59

## 2019-09-03 ENCOUNTER — Ambulatory Visit: Payer: No Typology Code available for payment source | Admitting: Primary Care

## 2019-09-03 ENCOUNTER — Other Ambulatory Visit (INDEPENDENT_AMBULATORY_CARE_PROVIDER_SITE_OTHER): Payer: 59

## 2019-09-03 DIAGNOSIS — Z13220 Encounter for screening for lipoid disorders: Secondary | ICD-10-CM

## 2019-09-03 LAB — BASIC METABOLIC PANEL
BUN: 11 mg/dL (ref 6–23)
CO2: 23 mEq/L (ref 19–32)
Calcium: 8.9 mg/dL (ref 8.4–10.5)
Chloride: 109 mEq/L (ref 96–112)
Creatinine, Ser: 0.76 mg/dL (ref 0.40–1.20)
GFR: 85.75 mL/min (ref >60.00)
Glucose, Bld: 83 mg/dL (ref 70–99)
Potassium: 3.7 mEq/L (ref 3.5–5.1)
Sodium: 137 mEq/L (ref 135–145)

## 2019-09-03 LAB — LIPID PANEL
Cholesterol: 176 mg/dL (ref 0–200)
HDL: 59.9 mg/dL (ref >39.00)
LDL Cholesterol: 101 mg/dL — ABNORMAL HIGH (ref 0–99)
NonHDL: 115.93
Total CHOL/HDL Ratio: 3
Triglycerides: 75 mg/dL (ref 0.0–149.0)
VLDL: 15 mg/dL (ref 0.0–40.0)

## 2019-11-09 ENCOUNTER — Other Ambulatory Visit: Payer: Self-pay

## 2019-11-09 ENCOUNTER — Ambulatory Visit
Admission: RE | Admit: 2019-11-09 | Discharge: 2019-11-09 | Disposition: A | Discharge status: Home / Self Care | Payer: 59 | Source: Ambulatory Visit | Attending: Obstetrics and Gynecology | Admitting: Obstetrics and Gynecology

## 2019-11-09 DIAGNOSIS — K639 Disease of intestine, unspecified: Secondary | ICD-10-CM

## 2019-11-09 MED ORDER — IOPAMIDOL (ISOVUE-300) INJECTION 61%
100.0000 mL | Freq: Once | INTRAVENOUS | Status: AC | PRN
  Administered 2019-11-09: 100 mL via INTRAVENOUS

## 2019-11-19 ENCOUNTER — Ambulatory Visit (INDEPENDENT_AMBULATORY_CARE_PROVIDER_SITE_OTHER): Payer: 59 | Admitting: Family Medicine

## 2019-11-19 ENCOUNTER — Other Ambulatory Visit: Payer: Self-pay

## 2019-11-19 ENCOUNTER — Encounter: Payer: Self-pay | Admitting: Family Medicine

## 2019-11-19 VITALS — BP 120/80 | Temp 98.4°F | Ht 66.0 in | Wt 167.0 lb

## 2019-11-19 DIAGNOSIS — R1031 Right lower quadrant pain: Secondary | ICD-10-CM | POA: Diagnosis not present

## 2019-11-19 LAB — POCT URINALYSIS DIPSTICK
Bilirubin, UA: NEGATIVE
Glucose, UA: NEGATIVE
Ketones, UA: NEGATIVE
Leukocytes, UA: NEGATIVE
Nitrite, UA: NEGATIVE
Protein, UA: NEGATIVE
Spec Grav, UA: 1.01 (ref 1.010–1.025)
Urobilinogen, UA: 0.2 E.U./dL
pH, UA: 6 (ref 5.0–8.0)

## 2019-11-19 NOTE — Progress Notes (Signed)
Subjective:     Christine Murillo is a 39 y.o. female presenting for Abdominal Pain     Abdominal Pain This is a new problem. The current episode started 1 to 4 weeks ago. The onset quality is gradual. The pain is located in the RLQ (groin area). The abdominal pain does not radiate. Associated symptoms include frequency. Pertinent negatives include no arthralgias, constipation, diarrhea, dysuria, myalgias, nausea or vomiting. Exacerbated by: sleeping on stomach was bothersome. The pain is relieved by liquids. ovarian cyst, endometriosis    Started 1 month ago Thought it might be UTI Was avoiding soda but over the weekend drank soda and coffee No intercourse since 2019  Called her GYN yesterday and had a CT scan 2 weeks ago  Woken her up from sleep Symptoms have been on and off for about 1 month Last 2-3 days when symptoms occurs  Treatment: none  Review of Systems  Gastrointestinal: Positive for abdominal pain. Negative for constipation, diarrhea, nausea and vomiting.  Genitourinary: Positive for frequency. Negative for dysuria.  Musculoskeletal: Negative for arthralgias and myalgias.     Social History   Tobacco Use  Smoking Status Never Smoker  Smokeless Tobacco Never Used        Objective:    BP Readings from Last 3 Encounters:  11/19/19 120/80  04/06/19 118/80  12/13/17 (!) 122/99   Wt Readings from Last 3 Encounters:  11/19/19 167 lb (75.8 kg)  04/06/19 166 lb 4 oz (75.4 kg)  12/13/17 150 lb (68 kg)    BP 120/80   Temp 98.4 F (36.9 C) (Temporal)   Ht 5\' 6"  (1.676 m)   Wt 167 lb (75.8 kg)   LMP 10/23/2019   BMI 26.95 kg/m    Physical Exam Constitutional:      General: She is not in acute distress.    Appearance: She is well-developed. She is not diaphoretic.  HENT:     Right Ear: External ear normal.     Left Ear: External ear normal.     Nose: Nose normal.  Eyes:     Conjunctiva/sclera: Conjunctivae normal.  Cardiovascular:     Rate  and Rhythm: Normal rate and regular rhythm.     Heart sounds: No murmur.  Pulmonary:     Effort: Pulmonary effort is normal. No respiratory distress.     Breath sounds: Normal breath sounds. No wheezing.  Abdominal:     General: Abdomen is flat. Bowel sounds are normal. There is no distension.     Palpations: Abdomen is soft.     Tenderness: There is no abdominal tenderness. There is no right CVA tenderness, left CVA tenderness, guarding or rebound.  Musculoskeletal:     Cervical back: Neck supple.  Skin:    General: Skin is warm and dry.     Capillary Refill: Capillary refill takes less than 2 seconds.  Neurological:     Mental Status: She is alert. Mental status is at baseline.  Psychiatric:        Mood and Affect: Mood normal.        Behavior: Behavior normal.    UA: neg LE, neg nitrites       Assessment & Plan:   Problem List Items Addressed This Visit    None    Visit Diagnoses    Right lower quadrant abdominal pain    -  Primary   Relevant Orders   POCT urinalysis dipstick (Completed)     UA w/o signs of infection.  Reviewed CT Abdomen done 1 week ago w/o evidence of acute intraabdominal process. Though does have endometriosis implant on the cecum and disc bulge which could be causing some referred pain.   Discussed based on intermittent symptoms and lack of associated symptoms suspect possible muscle strain. Recommend tylenol/ibuprofen prn. Could consider PT if recurrent and limiting activities.   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   Return if symptoms worsen or fail to improve.  Lesleigh Noe, MD

## 2019-11-19 NOTE — Patient Instructions (Signed)
Suspect maybe a muscle strain  Try tylenol or ibuprofen  If no improvement and you would like to try physical therapy -- mychart or call

## 2019-11-23 ENCOUNTER — Other Ambulatory Visit: Payer: Self-pay | Admitting: Obstetrics and Gynecology

## 2019-11-23 DIAGNOSIS — R1900 Intra-abdominal and pelvic swelling, mass and lump, unspecified site: Secondary | ICD-10-CM

## 2019-12-23 ENCOUNTER — Ambulatory Visit
Admission: RE | Admit: 2019-12-23 | Discharge: 2019-12-23 | Disposition: A | Discharge status: Home / Self Care | Payer: 59 | Source: Ambulatory Visit | Attending: Obstetrics and Gynecology | Admitting: Obstetrics and Gynecology

## 2019-12-23 ENCOUNTER — Other Ambulatory Visit: Payer: Self-pay

## 2019-12-23 DIAGNOSIS — R1900 Intra-abdominal and pelvic swelling, mass and lump, unspecified site: Secondary | ICD-10-CM

## 2019-12-23 MED ORDER — GADOBENATE DIMEGLUMINE 529 MG/ML IV SOLN
15.0000 mL | Freq: Once | INTRAVENOUS | Status: AC | PRN
  Administered 2019-12-23: 15 mL via INTRAVENOUS

## 2020-03-24 DIAGNOSIS — K602 Anal fissure, unspecified: Secondary | ICD-10-CM | POA: Insufficient documentation

## 2020-03-24 DIAGNOSIS — K625 Hemorrhage of anus and rectum: Secondary | ICD-10-CM | POA: Insufficient documentation

## 2020-10-03 ENCOUNTER — Other Ambulatory Visit: Payer: Self-pay

## 2020-10-03 ENCOUNTER — Ambulatory Visit (INDEPENDENT_AMBULATORY_CARE_PROVIDER_SITE_OTHER): Payer: 59

## 2020-10-03 ENCOUNTER — Ambulatory Visit (INDEPENDENT_AMBULATORY_CARE_PROVIDER_SITE_OTHER): Payer: 59 | Admitting: Podiatry

## 2020-10-03 ENCOUNTER — Encounter: Payer: Self-pay | Admitting: Podiatry

## 2020-10-03 DIAGNOSIS — L603 Nail dystrophy: Secondary | ICD-10-CM | POA: Diagnosis not present

## 2020-10-03 DIAGNOSIS — M2041 Other hammer toe(s) (acquired), right foot: Secondary | ICD-10-CM

## 2020-10-03 NOTE — Patient Instructions (Signed)
Look for salicylic acid 78% cream or ointment on [employer] and apply daily with a file / pumice stone   The silicone toe caps can be bought on [employer] or at   [email or web address]

## 2020-10-06 ENCOUNTER — Encounter: Payer: Self-pay | Admitting: Podiatry

## 2020-10-06 NOTE — Progress Notes (Signed)
  Subjective:  Patient ID: Christine Murillo, female    DOB: 09-Jul-1980,  MRN: [id number]  Chief Complaint  Patient presents with  . Nail Problem  . Callouses    Patient presents today for corn on right 5th toe and nail fungus bilat hallux    40 y.o. female presents with the above complaint. History confirmed with patient.   Objective:  Physical Exam: warm, good capillary refill, no trophic changes or ulcerative lesions, normal DP and PT pulses and normal sensory exam. 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 Hammertoe of right foot   2. Nail dystrophy      Plan:  Patient was evaluated and treated and all questions answered.  Discussed etiology and treatment options for what is likely onychomycosis.  I recommended taking a fungal culture to evaluate for this to see what it is and then we can treat it with an oral or topical agent.  This was done today  Discussed treatment and etiology of the corn on the right fifth toe which is painful for her and how this relates to the hammertoe and adductovarus deformity.  We discussed surgical and nonsurgical treatment options.  I recommended nonsurgical treatment currently with shaving lesion down which I did today and applied a silicone toe cap.  If this becomes recurrently bothersome could consider corn excision and resection of the bone spur.  Return in about 6 weeks (around 11/14/2020) for re-check corn / toe.

## 2020-10-13 ENCOUNTER — Encounter: Payer: Self-pay | Admitting: *Deleted

## 2020-10-15 IMAGING — CT CT ABD-PELV W/ CM
1 of 2 series · 14 of 32 positions shown, 19 images · IV contrast (APPLIED)
Comparison: 01/28/2014

CLINICAL DATA: Follow-up of bowel wall thickening within the cecal
tip. History of endometriosis. Prior right ovarian resection for
benign tumor.

EXAM:
CT ABDOMEN AND PELVIS WITH CONTRAST
TECHNIQUE: Multidetector CT imaging of the abdomen and pelvis was performed
using the standard protocol following bolus administration of
intravenous contrast.
CONTRAST:  100mL [id number] IOPAMIDOL ([id number]) INJECTION 61%

[Series 2: abd/pelvis w/cm · axial · 0.74mm/px · z∈[+471,+871]mm · 14 of 90 slices shown, 19 images]
[im 5/90  soft-tissue]
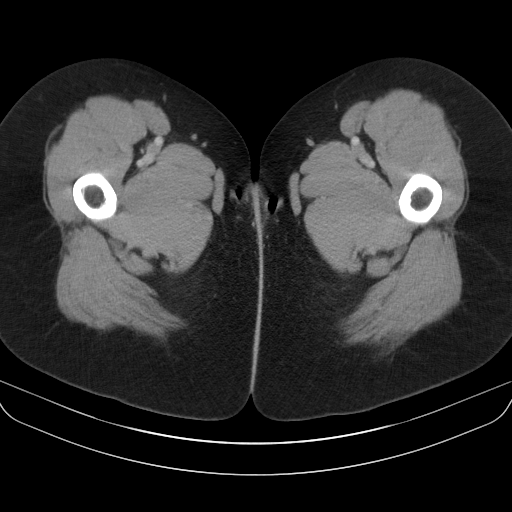
[im 5/90  bone]
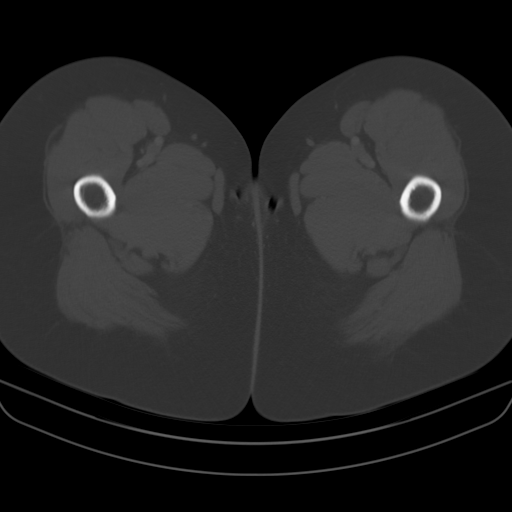
[im 10/90  soft-tissue]
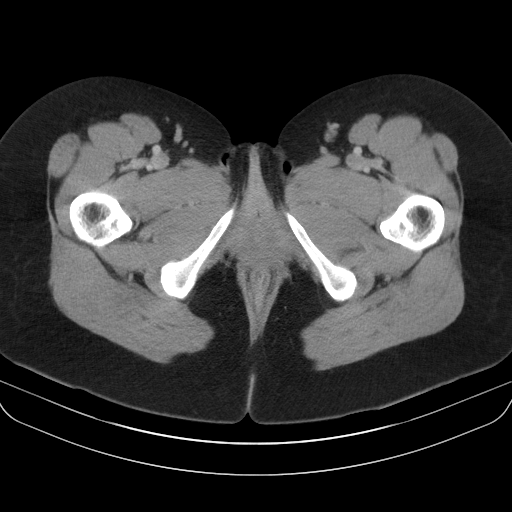
[im 20/90  soft-tissue]
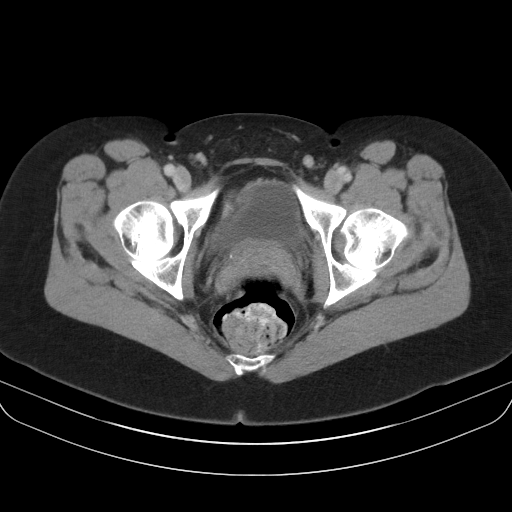
[im 25/90  soft-tissue]
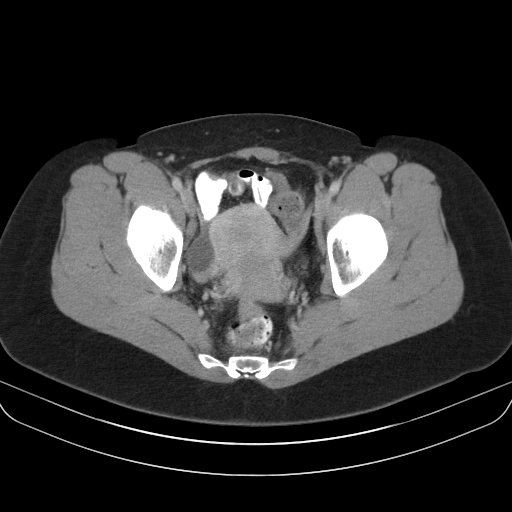
[im 30/90  soft-tissue]
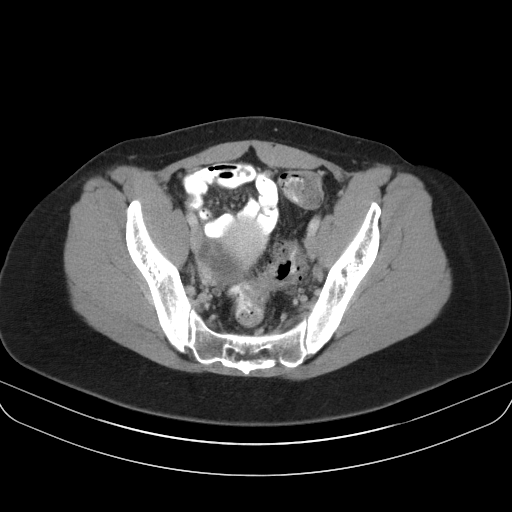
[im 40/90  soft-tissue]
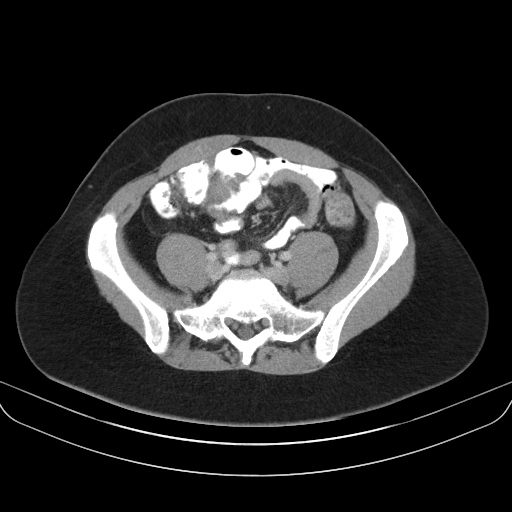
[im 45/90  soft-tissue]
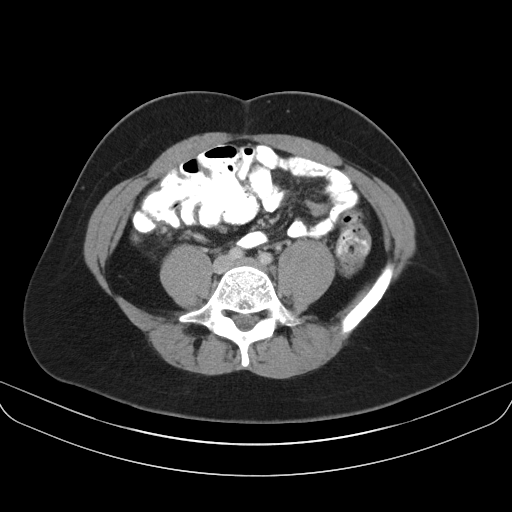
[im 50/90  soft-tissue]
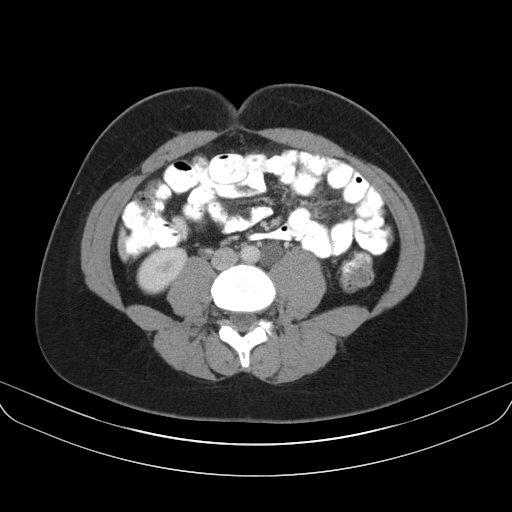
[im 60/90  soft-tissue]
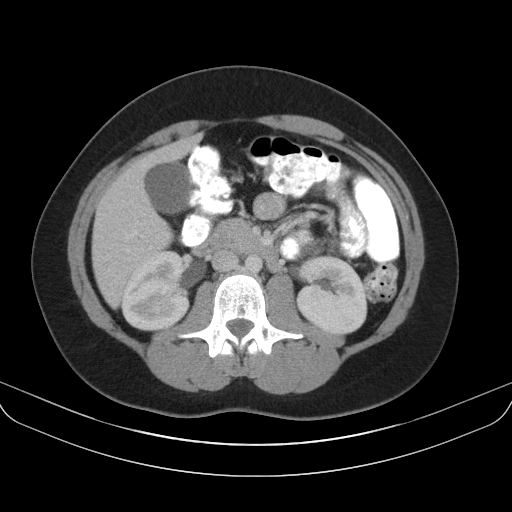
[im 60/90  bone]
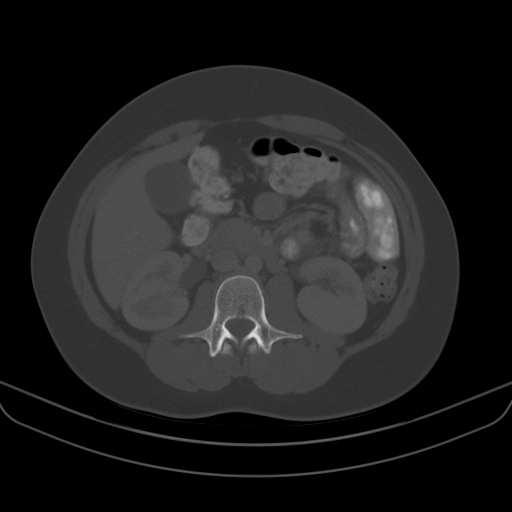
[im 65/90  soft-tissue]
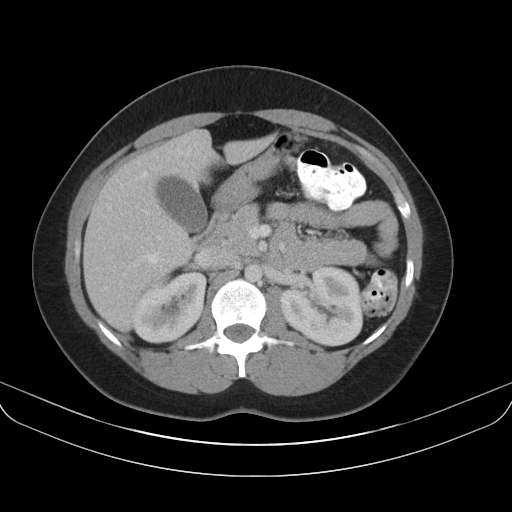
[im 70/90  soft-tissue]
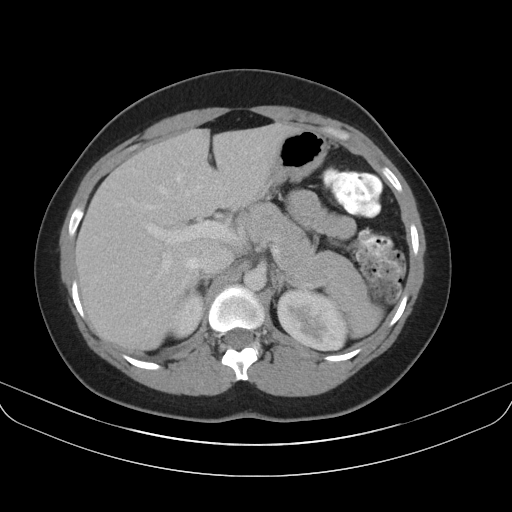
[im 70/90  lung]
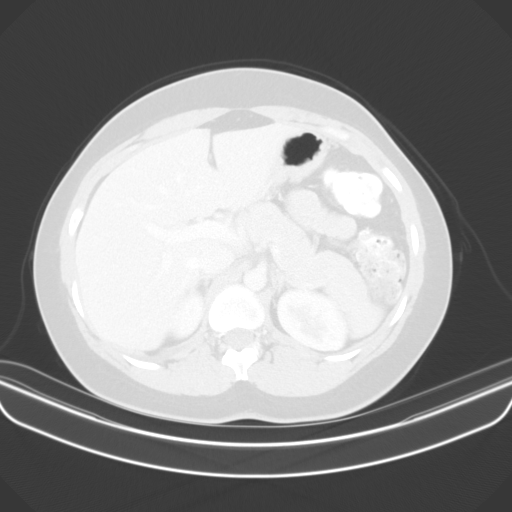
[im 75/90  lung]
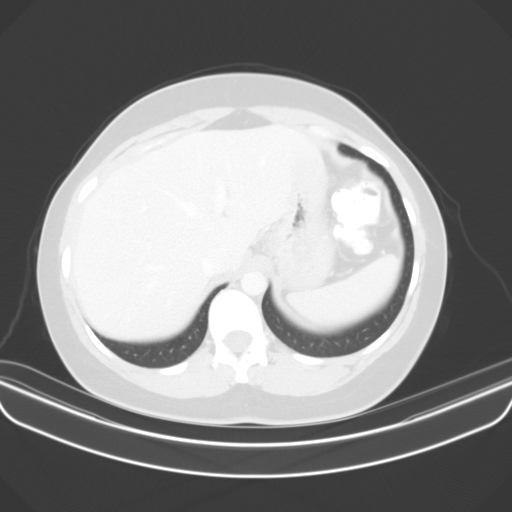
[im 80/90  soft-tissue]
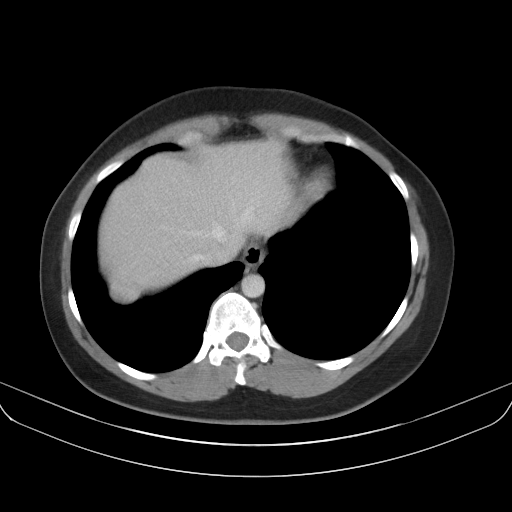
[im 80/90  lung]
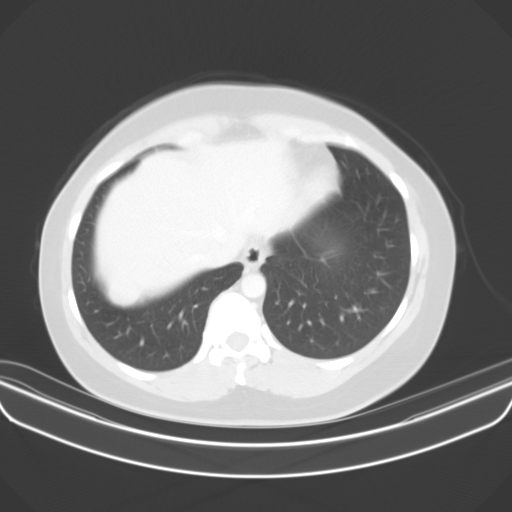
[im 85/90  soft-tissue]
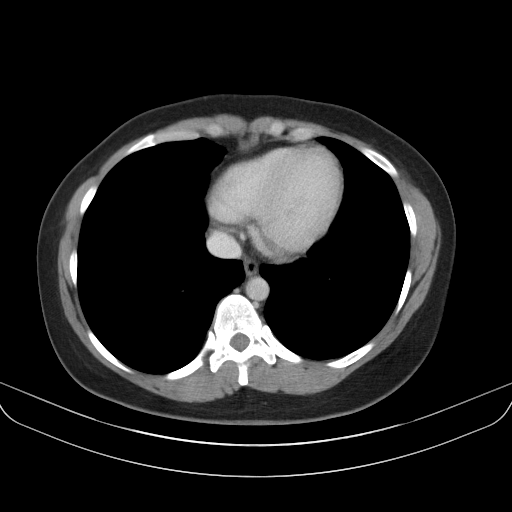
[im 85/90  lung]
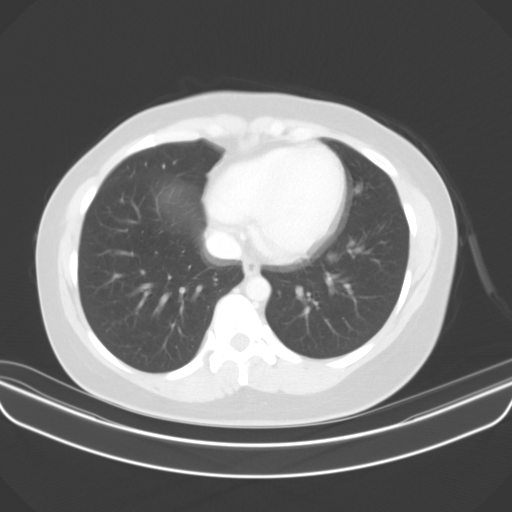

[14 of 32 positions shown; findings below may reference images not displayed]

FINDINGS: Lower chest: Benign right middle lobe pulmonary nodule of 4 mm,
similar. Normal heart size without pericardial or pleural effusion.

Hepatobiliary: Normal liver. Normal gallbladder, without biliary
ductal dilatation.

Pancreas: Normal, without mass or ductal dilatation.

Spleen: Normal in size, without focal abnormality.

Adrenals/Urinary Tract: Normal adrenal glands. Right extrarenal
pelvis. Normal left kidney and urinary bladder.

Stomach/Bowel: Normal stomach, without wall thickening. Soft tissue
density about the inferior portion of the cecum measures 2.4 x
cm on 52/2. Compare 2.5 x 2.4 cm on the prior exam (when
remeasured). No bowel obstruction. Appendix felt to be identified on
coronal image 38.

Normal small bowel.

Vascular/Lymphatic: Normal caliber of the aorta and branch vessels.
Left periaortic retroperitoneal 1.6 cm low-density structure is
favored to represent a lymphangioma. Example image 39/2. This is
enlarged from 1.1 cm on 01/28/2014. No abdominopelvic adenopathy.

Reproductive: Normal uterus. Right adnexal cystic lesions of up to
3.0 cm.

Other: No significant free fluid.

Musculoskeletal: No acute osseous abnormality. Mild disc bulge at
the lumbosacral junction
IMPRESSION: 1. Since 0343, similar size of a soft tissue density lesion along
the inferior aspect of the cecum. Given the clinical history,
favored to represent an endometriosis implant.
2. No bowel obstruction or other acute complication.
3. Enlargement of an abdominal retroperitoneal low-density lesion,
likely a lymphangioma.

## 2020-11-16 ENCOUNTER — Ambulatory Visit: Payer: 59 | Admitting: Podiatry

## 2022-02-20 ENCOUNTER — Emergency Department: Admission: EM | Admit: 2022-02-20 | Discharge: 2022-02-20 | Discharge status: Against Medical Advice | Payer: 59

## 2022-03-18 HISTORY — PX: ABDOMINAL HYSTERECTOMY: SHX81

## 2022-08-22 NOTE — Progress Notes (Unsigned)
Tomasita Morrow, NP-C Phone: [phone]  Christine Murillo is a 42 y.o. female who presents today to establish care and for annual exam. She has no complaints or new concerns today. She is doing well on all of her medications. She was recently started on Flomax by Urology for kidney stones.   Patient reports an extensive history of endometriosis. She underwent a totally hysterectomy for this in October 2023. She did have to have part of her bowel and bladder removed also due to adhesions. She had an ileostomy reversal on January 18th, 2024. She is followed closely by GI, Gynecology and Urology. She is on hormone replacement therapy.   She also reports having to take oral iron in the past but she is no longer taking it, she would like to have her iron checked today to see if this is something that she needs to continue.   Diet: Reports her diet could be better, she eats a lot of sweets and carbs.  Exercise: Zumba classes 2 days a week and strength/conditioning class once a week Pap smear: Total hysterectomy in October Colonoscopy: 2015- Scheduled for beginning of 2025 already with GI. Mammogram: Never- start at age 78, due in June. 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   Other and unspecified ovarian cysts    Palpitations 09/27/2017   Hemorrhoids 09/27/2017   Endometriosis 09/27/2017   Preventative health care 04/06/2019   Anal fissure 03/24/2020   Rectal bleeding 03/24/2020   Vitamin D deficiency 08/23/2022   Iron deficiency anemia 08/23/2022   Resolved  Ambulatory Problems    Diagnosis Date Noted   No Resolved Ambulatory Problems   Past Medical History:  Diagnosis Date   Colon polyps    Constipation    Endometriosis 2013   GERD (gastroesophageal reflux disease)     Family History  Problem Relation Age of Onset   Hypertension Mother    Lymphoma Mother         b cell  dx in 2020   Alcohol abuse Father    Hypertension Father    Cancer Maternal Grandmother    Pancreatic cancer Maternal Grandmother    Cancer Paternal Grandmother    Ovarian cancer Paternal Grandmother    Cancer Paternal Grandfather     Social History   Socioeconomic History   Marital status: Single    Spouse name: Not on file   Number of children: Not on file   Years of education: Not on file   Highest education level: Not on file  Occupational History   Not on file  Tobacco Use   Smoking status: Never   Smokeless tobacco: Never  Substance and Sexual Activity   Alcohol use: Yes    Comment: socially   Drug use: No   Sexual activity: Yes    Birth control/protection: None  Other Topics Concern   Not on file  Social History Narrative   Single.   No children.   Works for [employer].   Enjoys baking, shopping.    Social Determinants of Health   Financial Resource Strain: Not on file  Food  Insecurity: Not on file  Transportation Needs: Not on file  Physical Activity: Not on file  Stress: Not on file  Social Connections: Not on file  Intimate Partner Violence: Not on file    ROS  General:  Negative for unexplained weight loss, fever Skin: Negative for new or changing mole, sore that won't heal HEENT: Negative for trouble hearing, trouble seeing, ringing in ears, mouth sores, hoarseness, change in voice, dysphagia. CV:  Negative for chest pain, dyspnea, edema, palpitations Resp: Negative for cough, dyspnea, hemoptysis GI: Negative for nausea, vomiting, diarrhea, constipation, abdominal pain, melena, hematochezia. GU: Negative for  dysuria, incontinence, urinary hesitance, hematuria, vaginal or penile discharge, polyuria, sexual difficulty, lumps in testicle or breasts MSK: Negative for muscle cramps or aches, joint pain or swelling Neuro: Negative for headaches, weakness, numbness, dizziness, passing out/fainting Psych: Negative for depression, anxiety, memory problems  Objective  Physical Exam Vitals:   08/23/22 0859  BP: 120/74  Pulse: 74  Temp: 98.7 F (37.1 C)  SpO2: 99%    BP Readings from Last 3 Encounters:  08/23/22 120/74  11/19/19 120/80  04/06/19 118/80   Wt Readings from Last 3 Encounters:  08/23/22 160 lb (72.6 kg)  11/19/19 167 lb (75.8 kg)  04/06/19 166 lb 4 oz (75.4 kg)    Physical Exam Constitutional:      General: She is not in acute distress.    Appearance: Normal appearance.  HENT:     Head: Normocephalic.     Right Ear: Tympanic membrane normal.     Left Ear: Tympanic membrane normal.     Nose: Nose normal.     Mouth/Throat:     Mouth: Mucous membranes are moist.     Pharynx: Oropharynx is clear.  Eyes:     Conjunctiva/sclera: Conjunctivae normal.     Pupils: Pupils are equal, round, and reactive to light.  Neck:     Thyroid: No thyromegaly.  Cardiovascular:     Rate and Rhythm: Normal rate and regular rhythm.     Heart sounds: Normal heart sounds.  Pulmonary:     Effort: Pulmonary effort is normal.     Breath sounds: Normal breath sounds.  Abdominal:     General: Abdomen is flat. Bowel sounds are normal.     Palpations: Abdomen is soft. There is no mass.     Tenderness: There is no abdominal tenderness.  Musculoskeletal:        General: Normal range of motion.  Lymphadenopathy:     Cervical: No cervical adenopathy.  Skin:    General: Skin is warm and dry.     Findings: No rash.  Neurological:     General: No focal deficit present.     Mental Status: She is alert.  Psychiatric:        Mood and Affect: Mood normal.        Behavior: Behavior normal.     Assessment/Plan:   Preventative health care Assessment & Plan: Physical exam complete. Lab work as outlined. Will contact patient with results. Start mammo in June, order placed, encouraged patient to call to schedule this. Colonoscopy scheduled with GI for the beginning of next year. Declined flu vaccine. Tetanus and COVID vaccines UTD. Recommended annual follow ups with Dentist and Ophthalmology. Advised healthy diet and exercise. Follow up with GI, Gyn and Urology as scheduled.   Orders: -     CBC with Differential/Platelet -     Comprehensive metabolic panel -     Lipid panel -  TSH -     Hemoglobin A1c  Endometriosis Assessment & Plan: Noted on bowel and bladder also. Doing well since surgical excision and total hysterectomy. Continue hormone replacement therapy. Encouraged follow up with Gyn as scheduled.    Vitamin D deficiency Assessment & Plan: Currently taking OTC daily supplement. Will check level today.   Orders: -     VITAMIN D 25 Hydroxy (Vit-D Deficiency, Fractures)  Iron deficiency anemia, unspecified iron deficiency anemia type Assessment & Plan: Hx of taking oral iron. No longer taking. Will check iron panel today.   Orders: -     IBC + Ferritin  Encounter for hepatitis C screening test for low risk patient -     Hepatitis C antibody; Future  Thyroid disorder screen -     TSH  Lipid screening -     Lipid panel  Screening mammogram for breast cancer -     3D Screening Mammogram, Left and Right; Future   Return in about 1 year (around 08/23/2023) for Annual Exam.   Tomasita Morrow, NP-C [place]

## 2022-08-23 ENCOUNTER — Ambulatory Visit (INDEPENDENT_AMBULATORY_CARE_PROVIDER_SITE_OTHER): Payer: 59 | Admitting: Nurse Practitioner

## 2022-08-23 ENCOUNTER — Encounter: Payer: Self-pay | Admitting: Nurse Practitioner

## 2022-08-23 ENCOUNTER — Telehealth: Payer: Self-pay

## 2022-08-23 VITALS — BP 120/74 | HR 74 | Temp 98.7°F | Ht 67.0 in | Wt 160.0 lb

## 2022-08-23 DIAGNOSIS — N809 Endometriosis, unspecified: Secondary | ICD-10-CM

## 2022-08-23 DIAGNOSIS — D509 Iron deficiency anemia, unspecified: Secondary | ICD-10-CM

## 2022-08-23 DIAGNOSIS — Z1159 Encounter for screening for other viral diseases: Secondary | ICD-10-CM

## 2022-08-23 DIAGNOSIS — E559 Vitamin D deficiency, unspecified: Secondary | ICD-10-CM

## 2022-08-23 DIAGNOSIS — Z1329 Encounter for screening for other suspected endocrine disorder: Secondary | ICD-10-CM | POA: Diagnosis not present

## 2022-08-23 DIAGNOSIS — Z0000 Encounter for general adult medical examination without abnormal findings: Secondary | ICD-10-CM | POA: Diagnosis not present

## 2022-08-23 DIAGNOSIS — Z13220 Encounter for screening for lipoid disorders: Secondary | ICD-10-CM

## 2022-08-23 DIAGNOSIS — Z1231 Encounter for screening mammogram for malignant neoplasm of breast: Secondary | ICD-10-CM

## 2022-08-23 LAB — LIPID PANEL
Cholesterol: 192 mg/dL (ref 0–200)
HDL: 73.1 mg/dL (ref >39.00)
LDL Cholesterol: 108 mg/dL — ABNORMAL HIGH (ref 0–99)
NonHDL: 118.59
Total CHOL/HDL Ratio: 3
Triglycerides: 55 mg/dL (ref 0.0–149.0)
VLDL: 11 mg/dL (ref 0.0–40.0)

## 2022-08-23 LAB — CBC WITH DIFFERENTIAL/PLATELET
Basophils Absolute: 0 10*3/uL (ref 0.0–0.1)
Basophils Relative: 0.7 % (ref 0.0–3.0)
Eosinophils Absolute: 0.1 10*3/uL (ref 0.0–0.7)
Eosinophils Relative: 2.5 % (ref 0.0–5.0)
HCT: 36.9 % (ref 36.0–46.0)
Hemoglobin: 12.2 g/dL (ref 12.0–15.0)
Lymphocytes Relative: 36.3 % (ref 12.0–46.0)
Lymphs Abs: 1.8 10*3/uL (ref 0.7–4.0)
MCHC: 33.1 g/dL (ref 30.0–36.0)
MCV: 96.4 fl (ref 78.0–100.0)
Monocytes Absolute: 0.5 10*3/uL (ref 0.1–1.0)
Monocytes Relative: 9.8 % (ref 3.0–12.0)
Neutro Abs: 2.5 10*3/uL (ref 1.4–7.7)
Neutrophils Relative %: 50.7 % (ref 43.0–77.0)
Platelets: 359 10*3/uL (ref 150.0–400.0)
RBC: 3.83 Mil/uL — ABNORMAL LOW (ref 3.87–5.11)
RDW: 15.6 % — ABNORMAL HIGH (ref 11.5–15.5)
WBC: 5 10*3/uL (ref 4.0–10.5)

## 2022-08-23 LAB — VITAMIN D 25 HYDROXY (VIT D DEFICIENCY, FRACTURES): VITD: 30.24 ng/mL (ref 30.00–100.00)

## 2022-08-23 LAB — COMPREHENSIVE METABOLIC PANEL
ALT: 17 U/L (ref 0–35)
AST: 17 U/L (ref 0–37)
Albumin: 3.6 g/dL (ref 3.5–5.2)
Alkaline Phosphatase: 53 U/L (ref 39–117)
BUN: 11 mg/dL (ref 6–23)
CO2: 25 mEq/L (ref 19–32)
Calcium: 9.3 mg/dL (ref 8.4–10.5)
Chloride: 107 mEq/L (ref 96–112)
Creatinine, Ser: 0.66 mg/dL (ref 0.40–1.20)
GFR: 110.27 mL/min (ref >60.00)
Glucose, Bld: 84 mg/dL (ref 70–99)
Potassium: 3.9 mEq/L (ref 3.5–5.1)
Sodium: 138 mEq/L (ref 135–145)
Total Bilirubin: 0.5 mg/dL (ref 0.2–1.2)
Total Protein: 6.9 g/dL (ref 6.0–8.3)

## 2022-08-23 LAB — HEMOGLOBIN A1C: Hgb A1c MFr Bld: 5.2 % (ref 4.6–6.5)

## 2022-08-23 LAB — TSH: TSH: 1.2 u[IU]/mL (ref 0.35–5.50)

## 2022-08-23 NOTE — Assessment & Plan Note (Signed)
Currently taking OTC daily supplement. Will check level today.

## 2022-08-23 NOTE — Assessment & Plan Note (Signed)
Physical exam complete. Lab work as outlined. Will contact patient with results. Start mammo in June, order placed, encouraged patient to call to schedule this. Colonoscopy scheduled with GI for the beginning of next year. Declined flu vaccine. Tetanus and COVID vaccines UTD. Recommended annual follow ups with Dentist and Ophthalmology. Advised healthy diet and exercise. Follow up with GI, Gyn and Urology as scheduled.

## 2022-08-23 NOTE — Assessment & Plan Note (Signed)
Noted on bowel and bladder also. Doing well since surgical excision and total hysterectomy. Continue hormone replacement therapy. Encouraged follow up with Gyn as scheduled.

## 2022-08-23 NOTE — Telephone Encounter (Signed)
Patient dropped off document  [employer] Results form , to be filled out by provider. Patient requested to send it via Call Patient to pick up within ASAP. Document is located in providers color folder at front office.Please advise at home number.

## 2022-08-23 NOTE — Assessment & Plan Note (Signed)
Hx of taking oral iron. No longer taking. Will check iron panel today.

## 2022-08-24 LAB — IBC + FERRITIN
Ferritin: 11.3 ng/mL (ref 10.0–291.0)
Iron: 106 ug/dL (ref 42–145)
Saturation Ratios: 32.2 % (ref 20.0–50.0)
TIBC: 329 ug/dL (ref 250.0–450.0)
Transferrin: 235 mg/dL (ref 212.0–360.0)

## 2022-08-27 NOTE — Telephone Encounter (Signed)
Form has been completed and has been placed in provider to be signed folder awaiting signature.

## 2022-08-28 NOTE — Telephone Encounter (Signed)
PCP signed the form and the forms have been faxed to [phone] (per the form)  A copy has been made and pt has been informed to pick up their copy.

## 2022-08-28 NOTE — Telephone Encounter (Signed)
Pts copy has been placed in designated pick up area.

## 2022-08-29 DIAGNOSIS — Z0279 Encounter for issue of other medical certificate: Secondary | ICD-10-CM

## 2023-02-13 ENCOUNTER — Ambulatory Visit: Payer: 59 | Admitting: Nurse Practitioner

## 2023-02-13 DIAGNOSIS — N898 Other specified noninflammatory disorders of vagina: Secondary | ICD-10-CM

## 2023-02-13 NOTE — Progress Notes (Deleted)
  Bethanie Dicker, NP-C Phone: [phone]  Christine Murillo is a 42 y.o. female who presents today for vaginal discharge.   Vaginitis: Patient complains of an abnormal vaginal discharge for {numbers; 0-10:33138} {time units:11}. Vaginal symptoms include {vaginitis symptoms:30830}.Vulvar symptoms include {vaginitis symptoms:30830}.STI Risk: {std dx list:14030}Discharge described as: {gyn vaginal discharge:721}.Other associated symptoms: {vaginitis symptoms:30830}.Menstrual pattern: She had been bleeding {vaginal bleeding history:725}. Contraception: {contraceptive method:5051}   Social History   Tobacco Use  Smoking Status Never  Smokeless Tobacco Never    Current Outpatient Medications on File Prior to Visit  Medication Sig Dispense Refill   Azelaic Acid 15 % cream Apply topically 2 (two) times daily.     cholecalciferol (VITAMIN D) 1000 UNITS tablet Take 1,000 Units by mouth 2 (two) times daily.     estradiol (ESTRACE) 0.1 MG/GM vaginal cream Place 1 Applicatorful vaginally.     estradiol (VIVELLE-DOT) 0.1 MG/24HR patch Place onto the skin.     letrozole (FEMARA) 2.5 MG tablet Take 7.5 mg by mouth daily.     Multiple Vitamin (MULTIVITAMIN) capsule Take 1 capsule daily by mouth.     No current facility-administered medications on file prior to visit.     ROS see history of present illness  Objective  Physical Exam There were no vitals filed for this visit.  BP Readings from Last 3 Encounters:  08/23/22 120/74  11/19/19 120/80  04/06/19 118/80   Wt Readings from Last 3 Encounters:  08/23/22 160 lb (72.6 kg)  11/19/19 167 lb (75.8 kg)  04/06/19 166 lb 4 oz (75.4 kg)    Physical Exam   Assessment/Plan: Please see individual problem list.  Vaginal discharge     Health Maintenance: ***  No follow-ups on file.   Bethanie Dicker, NP-C [facility] - [employer]

## 2023-02-22 ENCOUNTER — Encounter: Payer: Self-pay | Admitting: Radiology

## 2023-02-22 ENCOUNTER — Ambulatory Visit
Admission: RE | Admit: 2023-02-22 | Discharge: 2023-02-22 | Disposition: A | Discharge status: Home / Self Care | Payer: 59 | Source: Ambulatory Visit | Attending: Nurse Practitioner | Admitting: Nurse Practitioner

## 2023-02-22 DIAGNOSIS — Z1231 Encounter for screening mammogram for malignant neoplasm of breast: Secondary | ICD-10-CM | POA: Diagnosis present

## 2023-08-27 ENCOUNTER — Ambulatory Visit (INDEPENDENT_AMBULATORY_CARE_PROVIDER_SITE_OTHER): Payer: 59 | Admitting: Nurse Practitioner

## 2023-08-27 ENCOUNTER — Encounter: Payer: Self-pay | Admitting: Nurse Practitioner

## 2023-08-27 VITALS — BP 110/76 | HR 67 | Temp 98.1°F | Ht 64.0 in | Wt 177.8 lb

## 2023-08-27 DIAGNOSIS — E669 Obesity, unspecified: Secondary | ICD-10-CM | POA: Insufficient documentation

## 2023-08-27 DIAGNOSIS — Z13220 Encounter for screening for lipoid disorders: Secondary | ICD-10-CM

## 2023-08-27 DIAGNOSIS — D509 Iron deficiency anemia, unspecified: Secondary | ICD-10-CM | POA: Diagnosis not present

## 2023-08-27 DIAGNOSIS — Z1329 Encounter for screening for other suspected endocrine disorder: Secondary | ICD-10-CM | POA: Diagnosis not present

## 2023-08-27 DIAGNOSIS — E559 Vitamin D deficiency, unspecified: Secondary | ICD-10-CM | POA: Diagnosis not present

## 2023-08-27 DIAGNOSIS — Z0001 Encounter for general adult medical examination with abnormal findings: Secondary | ICD-10-CM | POA: Diagnosis not present

## 2023-08-27 DIAGNOSIS — Z1231 Encounter for screening mammogram for malignant neoplasm of breast: Secondary | ICD-10-CM

## 2023-08-27 DIAGNOSIS — G5621 Lesion of ulnar nerve, right upper limb: Secondary | ICD-10-CM | POA: Insufficient documentation

## 2023-08-27 LAB — COMPREHENSIVE METABOLIC PANEL
ALT: 11 U/L (ref 0–35)
AST: 14 U/L (ref 0–37)
Albumin: 4 g/dL (ref 3.5–5.2)
Alkaline Phosphatase: 48 U/L (ref 39–117)
BUN: 10 mg/dL (ref 6–23)
CO2: 25 meq/L (ref 19–32)
Calcium: 9.2 mg/dL (ref 8.4–10.5)
Chloride: 106 meq/L (ref 96–112)
Creatinine, Ser: 0.64 mg/dL (ref 0.40–1.20)
GFR: 110.3 mL/min (ref >60.00)
Glucose, Bld: 86 mg/dL (ref 70–99)
Potassium: 4 meq/L (ref 3.5–5.1)
Sodium: 137 meq/L (ref 135–145)
Total Bilirubin: 0.5 mg/dL (ref 0.2–1.2)
Total Protein: 7.2 g/dL (ref 6.0–8.3)

## 2023-08-27 LAB — CBC WITH DIFFERENTIAL/PLATELET
Basophils Absolute: 0 10*3/uL (ref 0.0–0.1)
Basophils Relative: 0.6 % (ref 0.0–3.0)
Eosinophils Absolute: 0.1 10*3/uL (ref 0.0–0.7)
Eosinophils Relative: 1.9 % (ref 0.0–5.0)
HCT: 39.7 % (ref 36.0–46.0)
Hemoglobin: 13.2 g/dL (ref 12.0–15.0)
Lymphocytes Relative: 41.2 % (ref 12.0–46.0)
Lymphs Abs: 2.3 10*3/uL (ref 0.7–4.0)
MCHC: 33.4 g/dL (ref 30.0–36.0)
MCV: 99.7 fl (ref 78.0–100.0)
Monocytes Absolute: 0.5 10*3/uL (ref 0.1–1.0)
Monocytes Relative: 9 % (ref 3.0–12.0)
Neutro Abs: 2.7 10*3/uL (ref 1.4–7.7)
Neutrophils Relative %: 47.3 % (ref 43.0–77.0)
Platelets: 329 10*3/uL (ref 150.0–400.0)
RBC: 3.98 Mil/uL (ref 3.87–5.11)
RDW: 13.3 % (ref 11.5–15.5)
WBC: 5.7 10*3/uL (ref 4.0–10.5)

## 2023-08-27 LAB — LIPID PANEL
Cholesterol: 193 mg/dL (ref 0–200)
HDL: 80.5 mg/dL (ref >39.00)
LDL Cholesterol: 88 mg/dL (ref 0–99)
NonHDL: 112.55
Total CHOL/HDL Ratio: 2
Triglycerides: 125 mg/dL (ref 0.0–149.0)
VLDL: 25 mg/dL (ref 0.0–40.0)

## 2023-08-27 LAB — VITAMIN B12: Vitamin B-12: 509 pg/mL (ref 211–911)

## 2023-08-27 LAB — HEMOGLOBIN A1C: Hgb A1c MFr Bld: 5.4 % (ref 4.6–6.5)

## 2023-08-27 LAB — IBC + FERRITIN
Ferritin: 26 ng/mL (ref 10.0–291.0)
Iron: 165 ug/dL — ABNORMAL HIGH (ref 42–145)
Saturation Ratios: 52.9 % — ABNORMAL HIGH (ref 20.0–50.0)
TIBC: 312.2 ug/dL (ref 250.0–450.0)
Transferrin: 223 mg/dL (ref 212.0–360.0)

## 2023-08-27 LAB — TSH: TSH: 2.09 u[IU]/mL (ref 0.35–5.50)

## 2023-08-27 LAB — VITAMIN D 25 HYDROXY (VIT D DEFICIENCY, FRACTURES): VITD: 45.08 ng/mL (ref 30.00–100.00)

## 2023-08-27 NOTE — Progress Notes (Signed)
 Bethanie Dicker, NP-C Phone: [phone]  Christine Murillo is a 43 y.o. female who presents today for annual exam.   Discussed the use of AI scribe software for clinical note transcription with the patient, who gave verbal consent to proceed.  History of Present Illness   The patient presents for an annual physical exam.  She experiences numbness and soreness in her right arm, particularly from the elbow down, primarily at night. The right arm 'really, really goes numb,' while the left arm 'kind of tingles.' The numbness persists even when she sleeps with her arms down. She has tried using a carpal tunnel brace, which helps, but she wakes up with swollen fingers. No chest pain, shortness of breath, back pain, headaches, dizziness, or skin changes. She reports some swelling in her right ankle related to a recent fall at work.  She underwent a hysterectomy in October 2023 and is currently on hormone replacement therapy provided by the doctor who performed the surgery. She is not taking iron supplements and had blood work done in February, which showed some abnormalities but nothing significant. She mentions that her iron deficiency was likely related to her periods, which are no longer a concern post-hysterectomy. No abdominal pain, constipation, diarrhea, painful urination, abnormal discharge, or painful intercourse.  She had a mammogram in September, which was normal but noted dense breast tissue. She had a colonoscopy in December, which was normal, and she is not due for another one for ten years unless issues arise.  She works part-time at [employer] and lives with her boyfriend who works second shift. She does not smoke or use drugs, drinks alcohol occasionally, about once every two weeks, and exercises two to three times a week, primarily doing cardio and line dancing. Her diet has improved with increased exercise, leading to less hunger and more home-cooked meals, although she still enjoys fried  foods. No mood problems, anxiety, or depression.      Social History   Tobacco Use  Smoking Status Never  Smokeless Tobacco Never    Current Outpatient Medications on File Prior to Visit  Medication Sig Dispense Refill   Azelaic Acid 15 % cream Apply topically 2 (two) times daily.     cholecalciferol (VITAMIN D) 1000 UNITS tablet Take 1,000 Units by mouth 2 (two) times daily.     estradiol (ESTRACE) 0.1 MG/GM vaginal cream Place 1 Applicatorful vaginally.     estradiol (ESTRACE) 0.5 MG tablet Take 0.5 mg by mouth daily.     Multiple Vitamin (MULTIVITAMIN) capsule Take 1 capsule daily by mouth.     No current facility-administered medications on file prior to visit.    ROS see history of present illness  Objective  Physical Exam Vitals:   08/27/23 0845  BP: 110/76  Pulse: 67  Temp: 98.1 F (36.7 C)  SpO2: 98%    BP Readings from Last 3 Encounters:  08/27/23 110/76  08/23/22 120/74  11/19/19 120/80   Wt Readings from Last 3 Encounters:  08/27/23 177 lb 12.8 oz (80.6 kg)  08/23/22 160 lb (72.6 kg)  11/19/19 167 lb (75.8 kg)    Physical Exam Constitutional:      General: She is not in acute distress.    Appearance: Normal appearance.  HENT:     Head: Normocephalic.     Right Ear: Tympanic membrane normal.     Left Ear: Tympanic membrane normal.     Nose: Nose normal.     Mouth/Throat:     Mouth: Mucous  membranes are moist.     Pharynx: Oropharynx is clear.  Eyes:     Conjunctiva/sclera: Conjunctivae normal.     Pupils: Pupils are equal, round, and reactive to light.  Neck:     Thyroid: No thyromegaly.  Cardiovascular:     Rate and Rhythm: Normal rate and regular rhythm.     Heart sounds: Normal heart sounds.  Pulmonary:     Effort: Pulmonary effort is normal.     Breath sounds: Normal breath sounds.  Abdominal:     General: Abdomen is flat. Bowel sounds are normal.     Palpations: Abdomen is soft. There is no mass.     Tenderness: There is no  abdominal tenderness.  Musculoskeletal:        General: Normal range of motion.  Lymphadenopathy:     Cervical: No cervical adenopathy.  Skin:    General: Skin is warm and dry.     Findings: No rash.  Neurological:     General: No focal deficit present.     Mental Status: She is alert.  Psychiatric:        Mood and Affect: Mood normal.        Behavior: Behavior normal.    Assessment/Plan: Please see individual problem list.  Encounter for routine adult medical exam with abnormal findings Assessment & Plan: Physical exam complete. We will check lab work as outlined and contact patient with results. Pap smear no longer indicated. She is up to date on mammograms and colonoscopy. Declined flu and additional COVID vaccines. Tetanus vaccine is up to date. Discussed the importance of a healthy lifestyle and regular screenings. Order a mammogram for September. Continue routine dental and eye exams. Encourage continued exercise and a balanced diet. Return to care in one year, sooner as needed.   Orders: -     Comprehensive metabolic panel  Ulnar nerve compression, right Assessment & Plan: Symptoms consistent with ulnar nerve compression/impingement. Will refer to Orthopedics for further evaluation.   Orders: -     Ambulatory referral to Orthopedics -     Vitamin B12  Obesity (BMI 30-39.9) Assessment & Plan: Weight gain of 17 pounds over the last year. Encourage healthy diet and regular exercise. We will check A1c today.   Orders: -     Hemoglobin A1c  Vitamin D deficiency -     VITAMIN D 25 Hydroxy (Vit-D Deficiency, Fractures)  Iron deficiency anemia, unspecified iron deficiency anemia type -     CBC with Differential/Platelet -     IBC + Ferritin  Thyroid disorder screen -     TSH  Lipid screening -     Lipid panel  Screening mammogram for breast cancer -     3D Screening Mammogram, Left and Right; Future   Return in about 1 year (around 08/26/2024) for Annual Exam,  sooner as needed.   Bethanie Dicker, NP-C [facility]

## 2023-08-27 NOTE — Assessment & Plan Note (Signed)
 Weight gain of 17 pounds over the last year. Encourage healthy diet and regular exercise. We will check A1c today.

## 2023-08-27 NOTE — Assessment & Plan Note (Signed)
 Physical exam complete. We will check lab work as outlined and contact patient with results. Pap smear no longer indicated. She is up to date on mammograms and colonoscopy. Declined flu and additional COVID vaccines. Tetanus vaccine is up to date. Discussed the importance of a healthy lifestyle and regular screenings. Order a mammogram for September. Continue routine dental and eye exams. Encourage continued exercise and a balanced diet. Return to care in one year, sooner as needed.

## 2023-08-27 NOTE — Assessment & Plan Note (Addendum)
 Symptoms consistent with ulnar nerve compression/impingement. Will refer to Orthopedics for further evaluation.

## 2024-02-24 ENCOUNTER — Encounter

## 2024-03-05 ENCOUNTER — Encounter

## 2024-03-06 ENCOUNTER — Ambulatory Visit
Admission: RE | Admit: 2024-03-06 | Discharge: 2024-03-06 | Disposition: A | Discharge status: Home / Self Care | Source: Ambulatory Visit | Attending: Nurse Practitioner | Admitting: Nurse Practitioner

## 2024-03-06 DIAGNOSIS — Z1231 Encounter for screening mammogram for malignant neoplasm of breast: Secondary | ICD-10-CM | POA: Diagnosis present

## 2024-03-11 ENCOUNTER — Ambulatory Visit: Payer: Self-pay | Admitting: Nurse Practitioner

## 2024-04-27 NOTE — Telephone Encounter (Signed)
 LVM  to see if pt is still needing physical forms

## 2024-04-27 NOTE — Telephone Encounter (Signed)
 Forms placed to be mailed off to pt

## 2024-09-01 ENCOUNTER — Encounter: Admitting: Nurse Practitioner
